# Patient Record
Sex: Female | Born: 1995
Health system: Southern US, Community
[De-identification: ages and names within clinical notes are randomized; demographics above are authoritative.]

## PROBLEM LIST (undated history)

## (undated) DIAGNOSIS — F329 Major depressive disorder, single episode, unspecified: Secondary | ICD-10-CM

## (undated) DIAGNOSIS — N62 Hypertrophy of breast: Secondary | ICD-10-CM

## (undated) DIAGNOSIS — M549 Dorsalgia, unspecified: Secondary | ICD-10-CM

## (undated) DIAGNOSIS — G8929 Other chronic pain: Secondary | ICD-10-CM

## (undated) DIAGNOSIS — F419 Anxiety disorder, unspecified: Secondary | ICD-10-CM

## (undated) DIAGNOSIS — M542 Cervicalgia: Secondary | ICD-10-CM

## (undated) DIAGNOSIS — F32A Depression, unspecified: Secondary | ICD-10-CM

## (undated) HISTORY — PX: WISDOM TOOTH EXTRACTION: SHX21

## (undated) NOTE — *Deleted (*Deleted)
Psychoeducational Group Note  Date: 09-13-20 Time:  1300  Group Topic/Focus:  Making Healthy Choices:   The focus of this group is to help patients identify negative/unhealthy choices they were using prior to admission and identify positive/healthier coping strategies to replace them upon discharge.  Participation Level:  Active  Participation Quality:  Appropriate  Affect:  Appropriate  Cognitive:  Oriented  Insight:  Improving  Engagement in Group:  Engaged  Additional Comments:    Judge, Christine A   

---
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
## 2015-01-28 ENCOUNTER — Encounter: Payer: Self-pay | Admitting: Medical

## 2015-01-28 ENCOUNTER — Telehealth: Payer: Self-pay

## 2015-01-28 ENCOUNTER — Ambulatory Visit (INDEPENDENT_AMBULATORY_CARE_PROVIDER_SITE_OTHER): Payer: Managed Care, Other (non HMO) | Admitting: Medical

## 2015-01-28 VITALS — BP 128/85 | HR 84 | Temp 97.9°F | Ht 63.2 in | Wt 222.2 lb

## 2015-01-28 DIAGNOSIS — N943 Premenstrual tension syndrome: Secondary | ICD-10-CM | POA: Diagnosis not present

## 2015-01-28 MED ORDER — DICLOFENAC SODIUM 75 MG PO TBEC: 75.0000 mg | DELAYED_RELEASE_TABLET | Freq: Two times a day (BID) | ORAL | Status: DC

## 2015-01-28 NOTE — Progress Notes (Signed)
   Subjective:    Patient ID: Debbie Alvarez, female    DOB: 1996-04-09, 19 y.o.   MRN: [id number]  HPI   Pt in states in past history of irregular cycles. In past would go every 2-3 months. But when would menstruate would be about a week but heavy. Over past year her cycles are regular but more brief but more pain with her cycles. Total 4-5 days of menstrual cramping pain. Pt lmp was beginning of February 1st. Preceding month menses jan 1. No pain presently. Pt states 1st day most painful. Then pain decrease with alleve or ibuprofen. Pt is not sexually active. Pt is a [employer] senior.   Review of Systems  Constitutional: Negative for fever, chills and fatigue.  Respiratory: Negative for cough, choking, chest tightness, shortness of breath and wheezing.   Cardiovascular: Negative for chest pain and palpitations.  Gastrointestinal: Negative for abdominal pain.  Genitourinary: Positive for menstrual problem. Negative for dysuria, frequency, flank pain, vaginal bleeding, vaginal discharge, enuresis and pelvic pain.       Menstrual pain. Moderate. Worst 1st day. Lasting total 4-5 days.  No current symptoms.  Musculoskeletal: Negative for back pain.  Neurological: Negative for dizziness, seizures, syncope, facial asymmetry, weakness, light-headedness, numbness and headaches.  Hematological: Negative for adenopathy. Does not bruise/bleed easily.  Psychiatric/Behavioral: Negative for suicidal ideas, hallucinations, behavioral problems, confusion, self-injury and dysphoric mood. The patient is not nervous/anxious.     Past Medical History  Diagnosis Date  . Tonsillar and adenoid hypertrophy 11/2014    snores during sleep, mother denies apnea    History   Social History  . Marital Status: Single    Spouse Name: N/A  . Number of Children: N/A  . Years of Education: N/A   Occupational History  . Not on file.   Social History Main Topics  . Smoking status: Never Smoker   .  Smokeless tobacco: Never Used  . Alcohol Use: No  . Drug Use: No  . Sexual Activity: Not on file   Other Topics Concern  . Not on file   Social History Narrative    Past Surgical History  Procedure Laterality Date  . No past surgeries    . Tonsillectomy/adenoidectomy/turbinate reduction N/A 12/01/2014    Procedure: TONSILLECTOMY/ADENOIDECTOMY/TURBINATE REDUCTION;  Surgeon: Drema Halonhristopher E Newman, MD;  Location: [facility];  Service: ENT;  Laterality: N/A;    No family history on file.  No Known Allergies  No current outpatient prescriptions on file prior to visit.   No current facility-administered medications on file prior to visit.    BP 128/85 mmHg  Pulse 84  Temp(Src) 97.9 F (36.6 C) (Oral)  Ht 5' 3.2" (1.605 m)  Wt 222 lb 3.2 oz (100.789 kg)  BMI 39.13 kg/m2  SpO2 98%  LMP 01/05/2015      Objective:   Physical Exam  General Appearance- Not in acute distress.  HEENT Eyes- Scleraeral/Conjuntiva-bilat- Not Yellow. Mouth & Throat- Normal.  Chest and Lung Exam Auscultation: Breath sounds:-Normal. Adventitious sounds:- No Adventitious sounds.  Cardiovascular Auscultation:Rythm - Regular. Heart Sounds -Normal heart sounds.  Abdomen Inspection:-Inspection Normal.  Palpation/Perucssion: Palpation and Percussion of the abdomen reveal- Non Tender, No Rebound tenderness, No rigidity(Guarding) and No Palpable abdominal masses.  Liver:-Normal.  Spleen:- Normal.   No suprapubic pain. NO pain over adenexa.        Assessment & Plan:

## 2015-01-28 NOTE — Telephone Encounter (Signed)
School note provided.

## 2015-01-28 NOTE — Patient Instructions (Addendum)
Premenstrual syndrome I think you have normal variant cramping with your cycle. I will give you stronger rx anti- inflammatory to take a couple of days prior to onset of menses and take during menses. If you get heavy or irregular cycles then would recommend/offer ocp.(or if planning to be sexually active. If your have pain over ovary directly that is significant then would recommend ultrasound to evaluate for cyst.(But I d not think this is occuring.)        Not to take any other otc nsaid while on diclofenac rx. Follow up 1 month or as needed.  Premenstrual Syndrome Premenstrual syndrome (PMS) is a condition that consists of physical, emotional, and behavioral symptoms that affect women of childbearing age. PMS occurs 5-14 days before the start of a menstrual period and often recurs in a predictable pattern. The symptoms go away a few days after the menstrual period starts. PMS can interfere in many ways with normal daily activities and can range from mild to severe. When PMS is considered severe, it may be diagnosed as premenstrual dysphoric disorder (PMDD). A small percentage of women are affected by PMS symptoms and an even smaller percentage of those women are affected by PMDD.  CAUSES  The exact cause of PMS is unknown, but it seems to be related to cyclic hormone changes that happen before menstruation. These hormones are thought to affect chemicals in the brain (serotonin) that can influence a person's mood.  SYMPTOMS  Symptoms of PMS recur consistently from month to month and go away completely after the menstrual period starts. The most common emotional or behavioral symptom is mood swings. These mood swings can be disabling and interfere with normal activities of daily living. Other common symptoms include depression and angry outbursts. Other symptoms may include:   Irritability.  Anxiety.  Crying spells.   Food cravings or appetite changes.   Changes in sexual desire.    Confusion.   Aggression.   Social withdrawal.   Poor concentration. The most common physical symptoms include a sense of bloating, breast pain, headaches, and extreme fatigue. Other physical symptoms include:   Backaches.   Swelling of the hands and feet.   Weight gain.   Hot flashes.  DIAGNOSIS  To make a diagnosis, your caregiver will ask questions to confirm that you are having a pattern of symptoms. Symptoms must:   Be present 5 days before the start of your period and be present at least 3 months in a row.   End within 4 days after your period starts.   Interfere with some of your normal activities.  Other conditions, such as thyroid disease, depression, and migraine headaches must be ruled out before a diagnosis of PMS is confirmed.  TREATMENT  Your caregiver may suggest ways to maintain a healthy lifestyle, such as exercise. Over-the-counter pain relievers may ease cramps, aches, pains, headaches, and breast tenderness. However, selective serotonin reuptake inhibitors (SSRIs) are medicines that are most beneficial in improving PMS if taken in the second half of the monthly cycle. They may be taken on a daily basis. The most effective oral contraceptive pill used for symptoms of PMS is one that contains the ingredient drospirenone. Taking 4 days off of the pill instead of the usual 7 days also has shown to increase effectiveness.  There are a number of drugs, dietary supplements, vitamins, and water pills (diuretics) which have been suggested to be helpful but have not shown to be of any benefit to improving PMS symptoms.  HOME CARE INSTRUCTIONS   For 2-3 months, write down your symptoms, their severity, and how long they last. This may help your caregiver prescribe the best treatment for your symptoms.  Exercise regularly as suggested by your caregiver.  Eat a regular, well-balanced diet.  Avoid caffeine, alcohol, and tobacco consumption.  Limit salt and  salty foods to lessen bloating and fluid retention.  Get enough sleep. Practice relaxation techniques.  Drink enough fluids to keep your urine clear or pale yellow.  Take medicines as directed by your caregiver.  Limit stress.  Take a multivitamin as directed by your caregiver. Document Released: 11/18/2000 Document Revised: 08/15/2012 Document Reviewed: 04/09/2012 [facility] Patient Information 2015 [employer], [place]. This information is not intended to replace advice given to you by your health care provider. Make sure you discuss any questions you have with your health care provider.

## 2015-01-28 NOTE — Progress Notes (Signed)
Pre visit review using our clinic review tool, if applicable. No additional management support is needed unless otherwise documented below in the visit note. 

## 2015-01-28 NOTE — Assessment & Plan Note (Signed)
I think you have normal variant cramping with your cycle. I will give you stronger rx anti- inflammatory to take a couple of days prior to onset of menses and take during menses. If you get heavy or irregular cycles then would recommend/offer ocp.(or if planning to be sexually active. If your have pain over ovary directly that is significant then would recommend ultrasound to evaluate for cyst.(But I d not think this is occuring.)

## 2015-03-16 ENCOUNTER — Ambulatory Visit (INDEPENDENT_AMBULATORY_CARE_PROVIDER_SITE_OTHER): Payer: Managed Care, Other (non HMO) | Admitting: Family Medicine

## 2015-03-16 ENCOUNTER — Encounter: Payer: Self-pay | Admitting: Family Medicine

## 2015-03-16 VITALS — BP 110/76 | HR 73 | Temp 98.8°F | Resp 16 | Ht 63.5 in | Wt 224.0 lb

## 2015-03-16 DIAGNOSIS — Z0000 Encounter for general adult medical examination without abnormal findings: Secondary | ICD-10-CM

## 2015-03-16 NOTE — Progress Notes (Signed)
Subjective:     Debbie Alvarez is a 19 y.o. female and is here for a comprehensive physical exam. The patient reports no problems.  History   Social History  . Marital Status: Single    Spouse Name: N/A  . Number of Children: N/A  . Years of Education: N/A   Occupational History  .      student   Social History Main Topics  . Smoking status: Never Smoker   . Smokeless tobacco: Never Used  . Alcohol Use: No  . Drug Use: No  . Sexual Activity: Not Currently    Birth Control/ Protection: Abstinence   Other Topics Concern  . Not on file   Social History Narrative   Exercise-- try, works out at home   Health Maintenance  Topic Date Due  . HIV Screening  10/21/2011  . PAP SMEAR  10/20/2014  . 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 She  does not have any pertinent problems on file. She  has past surgical history that includes No past surgeries and Tonsillectomy/adenoidectomy/turbinate reduction (N/A, 12/01/2014). Her family history includes Hypertension in her maternal grandmother. She  reports that she has never smoked. She has never used smokeless tobacco. She reports that she does not drink alcohol or use illicit drugs. She currently has no medications in their medication list. No current outpatient prescriptions on file prior to visit.   No current facility-administered medications on file prior to visit.   She has No Known Allergies..  Review of Systems Review of Systems  Constitutional: Negative for activity change, appetite change and fatigue.  HENT: Negative for hearing loss, congestion, tinnitus and ear discharge.  dentist [id number] Eyes: Negative for visual disturbance (see optho q1y -- vision corrected to 20/20 with glasses).  Respiratory: Negative for cough, chest tightness and shortness of  breath.   Cardiovascular: Negative for chest pain, palpitations and leg swelling.  Gastrointestinal: Negative for abdominal pain, diarrhea, constipation and abdominal distention.  Genitourinary: Negative for urgency, frequency, decreased urine volume and difficulty urinating.  Musculoskeletal: Negative for back pain, arthralgias and gait problem.  Skin: Negative for color change, pallor and rash.  Neurological: Negative for dizziness, light-headedness, numbness and headaches.  Hematological: Negative for adenopathy. Does not bruise/bleed easily.  Psychiatric/Behavioral: Negative for suicidal ideas, confusion, sleep disturbance, self-injury, dysphoric mood, decreased concentration and agitation.       Objective:    BP 110/76 mmHg  Pulse 73  Temp(Src) 98.8 F (37.1 C) (Oral)  Resp 16  Ht 5' 3.5" (1.613 m)  Wt 224 lb (101.606 kg)  BMI 39.05 kg/m2  SpO2 99%  LMP 03/13/2015 General appearance: alert, cooperative, appears stated age and no distress Head: Normocephalic, without obvious abnormality, atraumatic Eyes: conjunctivae/corneas clear. PERRL, EOM's intact. Fundi benign. Ears: normal TM's and external ear canals both ears Nose: Nares normal. Septum midline. Mucosa normal. No drainage or sinus tenderness. Throat: lips, mucosa, and tongue normal; teeth and gums normal Neck: no adenopathy, no carotid bruit, no JVD, supple, symmetrical, trachea midline and thyroid not enlarged, symmetric, no tenderness/mass/nodules Back: symmetric, no curvature. ROM normal. No CVA tenderness. Lungs: clear to auscultation bilaterally Breasts: normal appearance, no masses or tenderness Heart: regular rate and rhythm, S1, S2 normal, no murmur, click, rub or gallop Abdomen: soft, non-tender; bowel sounds normal; no masses,  no organomegaly Pelvic: deferred  Extremities: extremities normal, atraumatic, no cyanosis or edema Pulses: 2+ and symmetric Skin: Skin color, texture, turgor normal. No rashes or  lesions Lymph nodes: Cervical, supraclavicular, and axillary nodes normal. Neurologic: Alert and oriented X 3, normal strength and tone. Normal symmetric reflexes. Normal coordination and gait Psych-- no depression, no anxiety      Assessment:    Healthy female exam.      Plan:    ghm utd Check labs See After Visit Summary for Counseling Recommendations   Need vaccination records

## 2015-03-16 NOTE — Progress Notes (Signed)
Pre visit review using our clinic review tool, if applicable. No additional management support is needed unless otherwise documented below in the visit note. 

## 2015-03-16 NOTE — Patient Instructions (Signed)
Preventive Care for Adults A healthy lifestyle and preventive care can promote health and wellness. Preventive health guidelines for women include the following key practices.  A routine yearly physical is a good way to check with your health care provider about your health and preventive screening. It is a chance to share any concerns and updates on your health and to receive a thorough exam.  Visit your dentist for a routine exam and preventive care every 6 months. Brush your teeth twice a day and floss once a day. Good oral hygiene prevents tooth decay and gum disease.  The frequency of eye exams is based on your age, health, family medical history, use of contact lenses, and other factors. Follow your health care provider's recommendations for frequency of eye exams.  Eat a healthy diet. Foods like vegetables, fruits, whole grains, low-fat dairy products, and lean protein foods contain the nutrients you need without too many calories. Decrease your intake of foods high in solid fats, added sugars, and salt. Eat the right amount of calories for you.Get information about a proper diet from your health care provider, if necessary.  Regular physical exercise is one of the most important things you can do for your health. Most adults should get at least 150 minutes of moderate-intensity exercise (any activity that increases your heart rate and causes you to sweat) each week. In addition, most adults need muscle-strengthening exercises on 2 or more days a week.  Maintain a healthy weight. The body mass index (BMI) is a screening tool to identify possible weight problems. It provides an estimate of body fat based on height and weight. Your health care provider can find your BMI and can help you achieve or maintain a healthy weight.For adults 20 years and older:  A BMI below 18.5 is considered underweight.  A BMI of 18.5 to 24.9 is normal.  A BMI of 25 to 29.9 is considered overweight.  A BMI of  30 and above is considered obese.  Maintain normal blood lipids and cholesterol levels by exercising and minimizing your intake of saturated fat. Eat a balanced diet with plenty of fruit and vegetables. Blood tests for lipids and cholesterol should begin at age 76 and be repeated every 5 years. If your lipid or cholesterol levels are high, you are over 50, or you are at high risk for heart disease, you may need your cholesterol levels checked more frequently.Ongoing high lipid and cholesterol levels should be treated with medicines if diet and exercise are not working.  If you smoke, find out from your health care provider how to quit. If you do not use tobacco, do not start.  Lung cancer screening is recommended for adults aged 22-80 years who are at high risk for developing lung cancer because of a history of smoking. A yearly low-dose CT scan of the lungs is recommended for people who have at least a 30-pack-year history of smoking and are a current smoker or have quit within the past 15 years. A pack year of smoking is smoking an average of 1 pack of cigarettes a day for 1 year (for example: 1 pack a day for 30 years or 2 packs a day for 15 years). Yearly screening should continue until the smoker has stopped smoking for at least 15 years. Yearly screening should be stopped for people who develop a health problem that would prevent them from having lung cancer treatment.  If you are pregnant, do not drink alcohol. If you are breastfeeding,  be very cautious about drinking alcohol. If you are not pregnant and choose to drink alcohol, do not have more than 1 drink per day. One drink is considered to be 12 ounces (355 mL) of beer, 5 ounces (148 mL) of wine, or 1.5 ounces (44 mL) of liquor.  Avoid use of street drugs. Do not share needles with anyone. Ask for help if you need support or instructions about stopping the use of drugs.  High blood pressure causes heart disease and increases the risk of  stroke. Your blood pressure should be checked at least every 1 to 2 years. Ongoing high blood pressure should be treated with medicines if weight loss and exercise do not work.  If you are 75-52 years old, ask your health care provider if you should take aspirin to prevent strokes.  Diabetes screening involves taking a blood sample to check your fasting blood sugar level. This should be done once every 3 years, after age 15, if you are within normal weight and without risk factors for diabetes. Testing should be considered at a younger age or be carried out more frequently if you are overweight and have at least 1 risk factor for diabetes.  Breast cancer screening is essential preventive care for women. You should practice "breast self-awareness." This means understanding the normal appearance and feel of your breasts and may include breast self-examination. Any changes detected, no matter how small, should be reported to a health care provider. Women in their 58s and 30s should have a clinical breast exam (CBE) by a health care provider as part of a regular health exam every 1 to 3 years. After age 16, women should have a CBE every year. Starting at age 53, women should consider having a mammogram (breast X-ray test) every year. Women who have a family history of breast cancer should talk to their health care provider about genetic screening. Women at a high risk of breast cancer should talk to their health care providers about having an MRI and a mammogram every year.  Breast cancer gene (BRCA)-related cancer risk assessment is recommended for women who have family members with BRCA-related cancers. BRCA-related cancers include breast, ovarian, tubal, and peritoneal cancers. Having family members with these cancers may be associated with an increased risk for harmful changes (mutations) in the breast cancer genes BRCA1 and BRCA2. Results of the assessment will determine the need for genetic counseling and  BRCA1 and BRCA2 testing.  Routine pelvic exams to screen for cancer are no longer recommended for nonpregnant women who are considered low risk for cancer of the pelvic organs (ovaries, uterus, and vagina) and who do not have symptoms. Ask your health care provider if a screening pelvic exam is right for you.  If you have had past treatment for cervical cancer or a condition that could lead to cancer, you need Pap tests and screening for cancer for at least 20 years after your treatment. If Pap tests have been discontinued, your risk factors (such as having a new sexual partner) need to be reassessed to determine if screening should be resumed. Some women have medical problems that increase the chance of getting cervical cancer. In these cases, your health care provider may recommend more frequent screening and Pap tests.  The HPV test is an additional test that may be used for cervical cancer screening. The HPV test looks for the virus that can cause the cell changes on the cervix. The cells collected during the Pap test can be  tested for HPV. The HPV test could be used to screen women aged 30 years and older, and should be used in women of any age who have unclear Pap test results. After the age of 30, women should have HPV testing at the same frequency as a Pap test.  Colorectal cancer can be detected and often prevented. Most routine colorectal cancer screening begins at the age of 50 years and continues through age 75 years. However, your health care provider may recommend screening at an earlier age if you have risk factors for colon cancer. On a yearly basis, your health care provider may provide home test kits to check for hidden blood in the stool. Use of a small camera at the end of a tube, to directly examine the colon (sigmoidoscopy or colonoscopy), can detect the earliest forms of colorectal cancer. Talk to your health care provider about this at age 50, when routine screening begins. Direct  exam of the colon should be repeated every 5-10 years through age 75 years, unless early forms of pre-cancerous polyps or small growths are found.  People who are at an increased risk for hepatitis B should be screened for this virus. You are considered at high risk for hepatitis B if:  You were born in a country where hepatitis B occurs often. Talk with your health care provider about which countries are considered high risk.  Your parents were born in a high-risk country and you have not received a shot to protect against hepatitis B (hepatitis B vaccine).  You have HIV or AIDS.  You use needles to inject street drugs.  You live with, or have sex with, someone who has hepatitis B.  You get hemodialysis treatment.  You take certain medicines for conditions like cancer, organ transplantation, and autoimmune conditions.  Hepatitis C blood testing is recommended for all people born from 1945 through 1965 and any individual with known risks for hepatitis C.  Practice safe sex. Use condoms and avoid high-risk sexual practices to reduce the spread of sexually transmitted infections (STIs). STIs include gonorrhea, chlamydia, syphilis, trichomonas, herpes, HPV, and human immunodeficiency virus (HIV). Herpes, HIV, and HPV are viral illnesses that have no cure. They can result in disability, cancer, and death.  You should be screened for sexually transmitted illnesses (STIs) including gonorrhea and chlamydia if:  You are sexually active and are younger than 24 years.  You are older than 24 years and your health care provider tells you that you are at risk for this type of infection.  Your sexual activity has changed since you were last screened and you are at an increased risk for chlamydia or gonorrhea. Ask your health care provider if you are at risk.  If you are at risk of being infected with HIV, it is recommended that you take a prescription medicine daily to prevent HIV infection. This is  called preexposure prophylaxis (PrEP). You are considered at risk if:  You are a heterosexual woman, are sexually active, and are at increased risk for HIV infection.  You take drugs by injection.  You are sexually active with a partner who has HIV.  Talk with your health care provider about whether you are at high risk of being infected with HIV. If you choose to begin PrEP, you should first be tested for HIV. You should then be tested every 3 months for as long as you are taking PrEP.  Osteoporosis is a disease in which the bones lose minerals and strength   with aging. This can result in serious bone fractures or breaks. The risk of osteoporosis can be identified using a bone density scan. Women ages 65 years and over and women at risk for fractures or osteoporosis should discuss screening with their health care providers. Ask your health care provider whether you should take a calcium supplement or vitamin D to reduce the rate of osteoporosis.  Menopause can be associated with physical symptoms and risks. Hormone replacement therapy is available to decrease symptoms and risks. You should talk to your health care provider about whether hormone replacement therapy is right for you.  Use sunscreen. Apply sunscreen liberally and repeatedly throughout the day. You should seek shade when your shadow is shorter than you. Protect yourself by wearing long sleeves, pants, a wide-brimmed hat, and sunglasses year round, whenever you are outdoors.  Once a month, do a whole body skin exam, using a mirror to look at the skin on your back. Tell your health care provider of new moles, moles that have irregular borders, moles that are larger than a pencil eraser, or moles that have changed in shape or color.  Stay current with required vaccines (immunizations).  Influenza vaccine. All adults should be immunized every year.  Tetanus, diphtheria, and acellular pertussis (Td, Tdap) vaccine. Pregnant women should  receive 1 dose of Tdap vaccine during each pregnancy. The dose should be obtained regardless of the length of time since the last dose. Immunization is preferred during the 27th-36th week of gestation. An adult who has not previously received Tdap or who does not know her vaccine status should receive 1 dose of Tdap. This initial dose should be followed by tetanus and diphtheria toxoids (Td) booster doses every 10 years. Adults with an unknown or incomplete history of completing a 3-dose immunization series with Td-containing vaccines should begin or complete a primary immunization series including a Tdap dose. Adults should receive a Td booster every 10 years.  Varicella vaccine. An adult without evidence of immunity to varicella should receive 2 doses or a second dose if she has previously received 1 dose. Pregnant females who do not have evidence of immunity should receive the first dose after pregnancy. This first dose should be obtained before leaving the health care facility. The second dose should be obtained 4-8 weeks after the first dose.  Human papillomavirus (HPV) vaccine. Females aged 13-26 years who have not received the vaccine previously should obtain the 3-dose series. The vaccine is not recommended for use in pregnant females. However, pregnancy testing is not needed before receiving a dose. If a female is found to be pregnant after receiving a dose, no treatment is needed. In that case, the remaining doses should be delayed until after the pregnancy. Immunization is recommended for any person with an immunocompromised condition through the age of 26 years if she did not get any or all doses earlier. During the 3-dose series, the second dose should be obtained 4-8 weeks after the first dose. The third dose should be obtained 24 weeks after the first dose and 16 weeks after the second dose.  Zoster vaccine. One dose is recommended for adults aged 60 years or older unless certain conditions are  present.  Measles, mumps, and rubella (MMR) vaccine. Adults born before 1957 generally are considered immune to measles and mumps. Adults born in 1957 or later should have 1 or more doses of MMR vaccine unless there is a contraindication to the vaccine or there is laboratory evidence of immunity to   each of the three diseases. A routine second dose of MMR vaccine should be obtained at least 28 days after the first dose for students attending postsecondary schools, health care workers, or international travelers. People who received inactivated measles vaccine or an unknown type of measles vaccine during 1963-1967 should receive 2 doses of MMR vaccine. People who received inactivated mumps vaccine or an unknown type of mumps vaccine before 1979 and are at high risk for mumps infection should consider immunization with 2 doses of MMR vaccine. For females of childbearing age, rubella immunity should be determined. If there is no evidence of immunity, females who are not pregnant should be vaccinated. If there is no evidence of immunity, females who are pregnant should delay immunization until after pregnancy. Unvaccinated health care workers born before 1957 who lack laboratory evidence of measles, mumps, or rubella immunity or laboratory confirmation of disease should consider measles and mumps immunization with 2 doses of MMR vaccine or rubella immunization with 1 dose of MMR vaccine.  Pneumococcal 13-valent conjugate (PCV13) vaccine. When indicated, a person who is uncertain of her immunization history and has no record of immunization should receive the PCV13 vaccine. An adult aged 19 years or older who has certain medical conditions and has not been previously immunized should receive 1 dose of PCV13 vaccine. This PCV13 should be followed with a dose of pneumococcal polysaccharide (PPSV23) vaccine. The PPSV23 vaccine dose should be obtained at least 8 weeks after the dose of PCV13 vaccine. An adult aged 19  years or older who has certain medical conditions and previously received 1 or more doses of PPSV23 vaccine should receive 1 dose of PCV13. The PCV13 vaccine dose should be obtained 1 or more years after the last PPSV23 vaccine dose.  Pneumococcal polysaccharide (PPSV23) vaccine. When PCV13 is also indicated, PCV13 should be obtained first. All adults aged 65 years and older should be immunized. An adult younger than age 65 years who has certain medical conditions should be immunized. Any person who resides in a nursing home or long-term care facility should be immunized. An adult smoker should be immunized. People with an immunocompromised condition and certain other conditions should receive both PCV13 and PPSV23 vaccines. People with human immunodeficiency virus (HIV) infection should be immunized as soon as possible after diagnosis. Immunization during chemotherapy or radiation therapy should be avoided. Routine use of PPSV23 vaccine is not recommended for American Indians, [place] Natives, or people younger than 65 years unless there are medical conditions that require PPSV23 vaccine. When indicated, people who have unknown immunization and have no record of immunization should receive PPSV23 vaccine. One-time revaccination 5 years after the first dose of PPSV23 is recommended for people aged 19-64 years who have chronic kidney failure, nephrotic syndrome, asplenia, or immunocompromised conditions. People who received 1-2 doses of PPSV23 before age 65 years should receive another dose of PPSV23 vaccine at age 65 years or later if at least 5 years have passed since the previous dose. Doses of PPSV23 are not needed for people immunized with PPSV23 at or after age 65 years.  Meningococcal vaccine. Adults with asplenia or persistent complement component deficiencies should receive 2 doses of quadrivalent meningococcal conjugate (MenACWY-D) vaccine. The doses should be obtained at least 2 months apart.  Microbiologists working with certain meningococcal bacteria, military recruits, people at risk during an outbreak, and people who travel to or live in countries with a high rate of meningitis should be immunized. A first-year college student up through age   21 years who is living in a residence hall should receive a dose if she did not receive a dose on or after her 16th birthday. Adults who have certain high-risk conditions should receive one or more doses of vaccine.  Hepatitis A vaccine. Adults who wish to be protected from this disease, have certain high-risk conditions, work with hepatitis A-infected animals, work in hepatitis A research labs, or travel to or work in countries with a high rate of hepatitis A should be immunized. Adults who were previously unvaccinated and who anticipate close contact with an international adoptee during the first 60 days after arrival in the [place] from a country with a high rate of hepatitis A should be immunized.  Hepatitis B vaccine. Adults who wish to be protected from this disease, have certain high-risk conditions, may be exposed to blood or other infectious body fluids, are household contacts or sex partners of hepatitis B positive people, are clients or workers in certain care facilities, or travel to or work in countries with a high rate of hepatitis B should be immunized.  Haemophilus influenzae type b (Hib) vaccine. A previously unvaccinated person with asplenia or sickle cell disease or having a scheduled splenectomy should receive 1 dose of Hib vaccine. Regardless of previous immunization, a recipient of a hematopoietic stem cell transplant should receive a 3-dose series 6-12 months after her successful transplant. Hib vaccine is not recommended for adults with HIV infection. Preventive Services / Frequency Ages 64 to 68 years  Blood pressure check.** / Every 1 to 2 years.  Lipid and cholesterol check.** / Every 5 years beginning at age  22.  Clinical breast exam.** / Every 3 years for women in their 88s and 53s.  BRCA-related cancer risk assessment.** / For women who have family members with a BRCA-related cancer (breast, ovarian, tubal, or peritoneal cancers).  Pap test.** / Every 2 years from ages 90 through 51. Every 3 years starting at age 21 through age 56 or 3 with a history of 3 consecutive normal Pap tests.  HPV screening.** / Every 3 years from ages 24 through ages 1 to 46 with a history of 3 consecutive normal Pap tests.  Hepatitis C blood test.** / For any individual with known risks for hepatitis C.  Skin self-exam. / Monthly.  Influenza vaccine. / Every year.  Tetanus, diphtheria, and acellular pertussis (Tdap, Td) vaccine.** / Consult your health care provider. Pregnant women should receive 1 dose of Tdap vaccine during each pregnancy. 1 dose of Td every 10 years.  Varicella vaccine.** / Consult your health care provider. Pregnant females who do not have evidence of immunity should receive the first dose after pregnancy.  HPV vaccine. / 3 doses over 6 months, if 72 and younger. The vaccine is not recommended for use in pregnant females. However, pregnancy testing is not needed before receiving a dose.  Measles, mumps, rubella (MMR) vaccine.** / You need at least 1 dose of MMR if you were born in 1957 or later. You may also need a 2nd dose. For females of childbearing age, rubella immunity should be determined. If there is no evidence of immunity, females who are not pregnant should be vaccinated. If there is no evidence of immunity, females who are pregnant should delay immunization until after pregnancy.  Pneumococcal 13-valent conjugate (PCV13) vaccine.** / Consult your health care provider.  Pneumococcal polysaccharide (PPSV23) vaccine.** / 1 to 2 doses if you smoke cigarettes or if you have certain conditions.  Meningococcal vaccine.** /  1 dose if you are age 19 to 21 years and a first-year college  student living in a residence hall, or have one of several medical conditions, you need to get vaccinated against meningococcal disease. You may also need additional booster doses.  Hepatitis A vaccine.** / Consult your health care provider.  Hepatitis B vaccine.** / Consult your health care provider.  Haemophilus influenzae type b (Hib) vaccine.** / Consult your health care provider. Ages 40 to 64 years  Blood pressure check.** / Every 1 to 2 years.  Lipid and cholesterol check.** / Every 5 years beginning at age 20 years.  Lung cancer screening. / Every year if you are aged 55-80 years and have a 30-pack-year history of smoking and currently smoke or have quit within the past 15 years. Yearly screening is stopped once you have quit smoking for at least 15 years or develop a health problem that would prevent you from having lung cancer treatment.  Clinical breast exam.** / Every year after age 40 years.  BRCA-related cancer risk assessment.** / For women who have family members with a BRCA-related cancer (breast, ovarian, tubal, or peritoneal cancers).  Mammogram.** / Every year beginning at age 40 years and continuing for as long as you are in good health. Consult with your health care provider.  Pap test.** / Every 3 years starting at age 30 years through age 65 or 70 years with a history of 3 consecutive normal Pap tests.  HPV screening.** / Every 3 years from ages 30 years through ages 65 to 70 years with a history of 3 consecutive normal Pap tests.  Fecal occult blood test (FOBT) of stool. / Every year beginning at age 50 years and continuing until age 75 years. You may not need to do this test if you get a colonoscopy every 10 years.  Flexible sigmoidoscopy or colonoscopy.** / Every 5 years for a flexible sigmoidoscopy or every 10 years for a colonoscopy beginning at age 50 years and continuing until age 75 years.  Hepatitis C blood test.** / For all people born from 1945 through  1965 and any individual with known risks for hepatitis C.  Skin self-exam. / Monthly.  Influenza vaccine. / Every year.  Tetanus, diphtheria, and acellular pertussis (Tdap/Td) vaccine.** / Consult your health care provider. Pregnant women should receive 1 dose of Tdap vaccine during each pregnancy. 1 dose of Td every 10 years.  Varicella vaccine.** / Consult your health care provider. Pregnant females who do not have evidence of immunity should receive the first dose after pregnancy.  Zoster vaccine.** / 1 dose for adults aged 60 years or older.  Measles, mumps, rubella (MMR) vaccine.** / You need at least 1 dose of MMR if you were born in 1957 or later. You may also need a 2nd dose. For females of childbearing age, rubella immunity should be determined. If there is no evidence of immunity, females who are not pregnant should be vaccinated. If there is no evidence of immunity, females who are pregnant should delay immunization until after pregnancy.  Pneumococcal 13-valent conjugate (PCV13) vaccine.** / Consult your health care provider.  Pneumococcal polysaccharide (PPSV23) vaccine.** / 1 to 2 doses if you smoke cigarettes or if you have certain conditions.  Meningococcal vaccine.** / Consult your health care provider.  Hepatitis A vaccine.** / Consult your health care provider.  Hepatitis B vaccine.** / Consult your health care provider.  Haemophilus influenzae type b (Hib) vaccine.** / Consult your health care provider. Ages 65   years and over  Blood pressure check.** / Every 1 to 2 years.  Lipid and cholesterol check.** / Every 5 years beginning at age 22 years.  Lung cancer screening. / Every year if you are aged 73-80 years and have a 30-pack-year history of smoking and currently smoke or have quit within the past 15 years. Yearly screening is stopped once you have quit smoking for at least 15 years or develop a health problem that would prevent you from having lung cancer  treatment.  Clinical breast exam.** / Every year after age 4 years.  BRCA-related cancer risk assessment.** / For women who have family members with a BRCA-related cancer (breast, ovarian, tubal, or peritoneal cancers).  Mammogram.** / Every year beginning at age 40 years and continuing for as long as you are in good health. Consult with your health care provider.  Pap test.** / Every 3 years starting at age 9 years through age 34 or 91 years with 3 consecutive normal Pap tests. Testing can be stopped between 65 and 70 years with 3 consecutive normal Pap tests and no abnormal Pap or HPV tests in the past 10 years.  HPV screening.** / Every 3 years from ages 57 years through ages 64 or 45 years with a history of 3 consecutive normal Pap tests. Testing can be stopped between 65 and 70 years with 3 consecutive normal Pap tests and no abnormal Pap or HPV tests in the past 10 years.  Fecal occult blood test (FOBT) of stool. / Every year beginning at age 15 years and continuing until age 17 years. You may not need to do this test if you get a colonoscopy every 10 years.  Flexible sigmoidoscopy or colonoscopy.** / Every 5 years for a flexible sigmoidoscopy or every 10 years for a colonoscopy beginning at age 86 years and continuing until age 71 years.  Hepatitis C blood test.** / For all people born from [birth year] through 1965 and any individual with known risks for hepatitis C.  Osteoporosis screening.** / A one-time screening for women ages 83 years and over and women at risk for fractures or osteoporosis.  Skin self-exam. / Monthly.  Influenza vaccine. / Every year.  Tetanus, diphtheria, and acellular pertussis (Tdap/Td) vaccine.** / 1 dose of Td every 10 years.  Varicella vaccine.** / Consult your health care provider.  Zoster vaccine.** / 1 dose for adults aged 61 years or older.  Pneumococcal 13-valent conjugate (PCV13) vaccine.** / Consult your health care provider.  Pneumococcal  polysaccharide (PPSV23) vaccine.** / 1 dose for all adults aged 28 years and older.  Meningococcal vaccine.** / Consult your health care provider.  Hepatitis A vaccine.** / Consult your health care provider.  Hepatitis B vaccine.** / Consult your health care provider.  Haemophilus influenzae type b (Hib) vaccine.** / Consult your health care provider. ** Family history and personal history of risk and conditions may change your health care provider's recommendations. Document Released: 01/17/2002 Document Revised: 04/07/2014 Document Reviewed: 04/18/2011 [facility] Patient Information 2015 [place], [place]. This information is not intended to replace advice given to you by your health care provider. Make sure you discuss any questions you have with your health care provider.

## 2015-03-17 LAB — HEPATIC FUNCTION PANEL
ALT: 14 U/L (ref 0–35)
AST: 15 U/L (ref 0–37)
Albumin: 4 g/dL (ref 3.5–5.2)
Alkaline Phosphatase: 73 U/L (ref 47–119)
BILIRUBIN DIRECT: 0 mg/dL (ref 0.0–0.3)
TOTAL PROTEIN: 7.6 g/dL (ref 6.0–8.3)
Total Bilirubin: 0.3 mg/dL (ref 0.3–1.2)

## 2015-03-17 LAB — CBC WITH DIFFERENTIAL/PLATELET
Basophils Absolute: 0 10*3/uL (ref 0.0–0.1)
Basophils Relative: 0.4 % (ref 0.0–3.0)
EOS ABS: 0.3 10*3/uL (ref 0.0–0.7)
Eosinophils Relative: 4.5 % (ref 0.0–5.0)
HEMATOCRIT: 34.8 % — AB (ref 36.0–49.0)
HEMOGLOBIN: 11.6 g/dL — AB (ref 12.0–16.0)
Lymphocytes Relative: 38.6 % (ref 24.0–48.0)
Lymphs Abs: 2.6 10*3/uL (ref 0.7–4.0)
MCHC: 33.3 g/dL (ref 31.0–37.0)
MCV: 84.8 fl (ref 78.0–98.0)
Monocytes Absolute: 0.5 10*3/uL (ref 0.1–1.0)
Monocytes Relative: 7.4 % (ref 3.0–12.0)
NEUTROS ABS: 3.3 10*3/uL (ref 1.4–7.7)
Neutrophils Relative %: 49.1 % (ref 43.0–71.0)
PLATELETS: 289 10*3/uL (ref 150.0–575.0)
RBC: 4.11 Mil/uL (ref 3.80–5.70)
RDW: 15.2 % (ref 11.4–15.5)
WBC: 6.7 10*3/uL (ref 4.5–13.5)

## 2015-03-17 LAB — BASIC METABOLIC PANEL
BUN: 13 mg/dL (ref 6–23)
CHLORIDE: 104 meq/L (ref 96–112)
CO2: 27 mEq/L (ref 19–32)
Calcium: 9.9 mg/dL (ref 8.4–10.5)
Creatinine, Ser: 0.69 mg/dL (ref 0.40–1.20)
GFR: 141.87 mL/min (ref >60.00)
Glucose, Bld: 70 mg/dL (ref 70–99)
Potassium: 4.2 mEq/L (ref 3.5–5.1)
Sodium: 137 mEq/L (ref 135–145)

## 2015-03-17 LAB — LIPID PANEL
CHOLESTEROL: 209 mg/dL — AB (ref 0–200)
HDL: 51 mg/dL (ref >39.00)
LDL Cholesterol: 132 mg/dL — ABNORMAL HIGH (ref 0–99)
NONHDL: 158
Total CHOL/HDL Ratio: 4
Triglycerides: 132 mg/dL (ref 0.0–149.0)
VLDL: 26.4 mg/dL (ref 0.0–40.0)

## 2015-03-17 LAB — TSH: TSH: 2.26 u[IU]/mL (ref 0.40–5.00)

## 2015-03-17 LAB — HIV ANTIBODY (ROUTINE TESTING W REFLEX): HIV: NONREACTIVE

## 2016-02-11 ENCOUNTER — Telehealth: Payer: Self-pay | Admitting: Family Medicine

## 2016-02-11 NOTE — Telephone Encounter (Signed)
Faxed for medical records. # [id number]

## 2016-02-24 ENCOUNTER — Telehealth: Payer: Self-pay | Admitting: *Deleted

## 2016-02-24 NOTE — Telephone Encounter (Signed)
Received Medical Records; forwarded to provider/SLS 03/22

## 2016-03-21 ENCOUNTER — Ambulatory Visit (INDEPENDENT_AMBULATORY_CARE_PROVIDER_SITE_OTHER): Payer: Managed Care, Other (non HMO) | Admitting: Family Medicine

## 2016-03-21 ENCOUNTER — Encounter: Payer: Self-pay | Admitting: Family Medicine

## 2016-03-21 VITALS — BP 106/72 | HR 83 | Temp 99.0°F | Ht 64.0 in | Wt 234.4 lb

## 2016-03-21 DIAGNOSIS — Z0000 Encounter for general adult medical examination without abnormal findings: Secondary | ICD-10-CM | POA: Diagnosis not present

## 2016-03-21 DIAGNOSIS — Z23 Encounter for immunization: Secondary | ICD-10-CM | POA: Diagnosis not present

## 2016-03-21 DIAGNOSIS — R7989 Other specified abnormal findings of blood chemistry: Secondary | ICD-10-CM | POA: Diagnosis not present

## 2016-03-21 LAB — POCT URINALYSIS DIPSTICK
BILIRUBIN UA: NEGATIVE
GLUCOSE UA: NEGATIVE
LEUKOCYTES UA: NEGATIVE
NITRITE UA: NEGATIVE
Protein, UA: NEGATIVE
RBC UA: NEGATIVE
Spec Grav, UA: 1.02
Urobilinogen, UA: 0.2
pH, UA: 8

## 2016-03-21 NOTE — Progress Notes (Signed)
Subjective:     Debbie Alvarez is a 20 y.o. female and is here for a comprehensive physical exam. The patient reports no problems.  Social History   Social History  . Marital Status: Single    Spouse Name: N/A  . Number of Children: N/A  . Years of Education: N/A   Occupational History  .      student   Social History Main Topics  . Smoking status: Never Smoker   . Smokeless tobacco: Never Used  . Alcohol Use: No  . Drug Use: No  . Sexual Activity: Not Currently    Birth Control/ Protection: Abstinence   Other Topics Concern  . Not on file   Social History Narrative   Exercise-- try, works out at home   Health Maintenance  Topic Date Due  . INFLUENZA VACCINE  07/05/2016  . TETANUS/TDAP  07/05/2025  . 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 She  does not have any pertinent problems on file. She  has past surgical history that includes No past surgeries and Tonsillectomy/adenoidectomy/turbinate reduction (N/A, 12/01/2014). Her family history includes Hypertension in her maternal grandmother. She  reports that she has never smoked. She has never used smokeless tobacco. She reports that she does not drink alcohol or use illicit drugs. She currently has no medications in their medication list. No current outpatient prescriptions on file prior to visit.   No current facility-administered medications on file prior to visit.   She has No Known Allergies..  Review of Systems Review of Systems  Constitutional: Negative for activity change, appetite change and fatigue.  HENT: Negative for hearing loss, congestion, tinnitus and ear discharge.  dentist q50m Eyes: Negative for visual disturbance (see optho q1y -- vision corrected to 20/20 with glasses).  Respiratory: Negative for cough, chest tightness and shortness  of breath.   Cardiovascular: Negative for chest pain, palpitations and leg swelling.  Gastrointestinal: Negative for abdominal pain, diarrhea, constipation and abdominal distention.  Genitourinary: Negative for urgency, frequency, decreased urine volume and difficulty urinating.  Musculoskeletal: Negative for back pain, arthralgias and gait problem.  Skin: Negative for color change, pallor and rash.  Neurological: Negative for dizziness, light-headedness, numbness and headaches.  Hematological: Negative for adenopathy. Does not bruise/bleed easily.  Psychiatric/Behavioral: Negative for suicidal ideas, confusion, sleep disturbance, self-injury, dysphoric mood, decreased concentration and agitation.      Objective:    BP 106/72 mmHg  Pulse 83  Temp(Src) 99 F (37.2 C) (Oral)  Ht  (1.626 m)  Wt 234 lb 6.4 oz (106.323 kg)  BMI 40.21 kg/m2  SpO2 99%  LMP 03/07/2016 General appearance: alert, cooperative, appears stated age and no distress , obese Head: Normocephalic, without obvious abnormality, atraumatic Eyes: conjunctivae/corneas clear. PERRL, EOM's intact. Fundi benign. Ears: normal TM's and external ear canals both ears Nose: Nares normal. Septum midline. Mucosa normal. No drainage or sinus tenderness. Throat: lips, mucosa, and tongue normal; teeth and gums normal Neck: no adenopathy, no carotid bruit, no JVD, supple, symmetrical, trachea midline and thyroid not enlarged, symmetric, no tenderness/mass/nodules Back: symmetric, no curvature. ROM normal. No CVA tenderness. Lungs: clear to auscultation bilaterally Breasts: normal appearance, no masses or tenderness Heart: S1, S2 normal Abdomen: soft, non-tender; bowel sounds normal; no masses,  no organomegaly Pelvic: deferred Extremities: extremities normal, atraumatic, no cyanosis or edema Pulses: 2+  and symmetric Skin: Skin color, texture, turgor normal. No rashes or lesions Lymph nodes: Cervical, supraclavicular, and  axillary nodes normal. Neurologic: Alert and oriented X 3, normal strength and tone. Normal symmetric reflexes. Normal coordination and gait Psych- no depression, no anxiety      Assessment:    Healthy female exam.      Plan:     ghm utd  Check labs See After Visit Summary for Counseling Recommendations \  1. Preventative health care   - Comprehensive metabolic panel - CBC with Differential/Platelet - Lipid panel - POCT urinalysis dipstick - TSH  2. Need for HPV vaccination   - HPV 9-valent vaccine,Recombinat (Gardasil 9)

## 2016-03-21 NOTE — Patient Instructions (Signed)
Preventive Care for Adults, Female A healthy lifestyle and preventive care can promote health and wellness. Preventive health guidelines for women include the following key practices.  A routine yearly physical is a good way to check with your health care provider about your health and preventive screening. It is a chance to share any concerns and updates on your health and to receive a thorough exam.  Visit your dentist for a routine exam and preventive care every 6 months. Brush your teeth twice a day and floss once a day. Good oral hygiene prevents tooth decay and gum disease.  The frequency of eye exams is based on your age, health, family medical history, use of contact lenses, and other factors. Follow your health care provider's recommendations for frequency of eye exams.  Eat a healthy diet. Foods like vegetables, fruits, whole grains, low-fat dairy products, and lean protein foods contain the nutrients you need without too many calories. Decrease your intake of foods high in solid fats, added sugars, and salt. Eat the right amount of calories for you.Get information about a proper diet from your health care provider, if necessary.  Regular physical exercise is one of the most important things you can do for your health. Most adults should get at least 150 minutes of moderate-intensity exercise (any activity that increases your heart rate and causes you to sweat) each week. In addition, most adults need muscle-strengthening exercises on 2 or more days a week.  Maintain a healthy weight. The body mass index (BMI) is a screening tool to identify possible weight problems. It provides an estimate of body fat based on height and weight. Your health care provider can find your BMI and can help you achieve or maintain a healthy weight.For adults 20 years and older:  A BMI below 18.5 is considered underweight.  A BMI of 18.5 to 24.9 is normal.  A BMI of 25 to 29.9 is considered overweight.  A  BMI of 30 and above is considered obese.  Maintain normal blood lipids and cholesterol levels by exercising and minimizing your intake of saturated fat. Eat a balanced diet with plenty of fruit and vegetables. Blood tests for lipids and cholesterol should begin at age 45 and be repeated every 5 years. If your lipid or cholesterol levels are high, you are over 50, or you are at high risk for heart disease, you may need your cholesterol levels checked more frequently.Ongoing high lipid and cholesterol levels should be treated with medicines if diet and exercise are not working.  If you smoke, find out from your health care provider how to quit. If you do not use tobacco, do not start.  Lung cancer screening is recommended for adults aged 45-80 years who are at high risk for developing lung cancer because of a history of smoking. A yearly low-dose CT scan of the lungs is recommended for people who have at least a 30-pack-year history of smoking and are a current smoker or have quit within the past 15 years. A pack year of smoking is smoking an average of 1 pack of cigarettes a day for 1 year (for example: 1 pack a day for 30 years or 2 packs a day for 15 years). Yearly screening should continue until the smoker has stopped smoking for at least 15 years. Yearly screening should be stopped for people who develop a health problem that would prevent them from having lung cancer treatment.  If you are pregnant, do not drink alcohol. If you are  breastfeeding, be very cautious about drinking alcohol. If you are not pregnant and choose to drink alcohol, do not have more than 1 drink per day. One drink is considered to be 12 ounces (355 mL) of beer, 5 ounces (148 mL) of wine, or 1.5 ounces (44 mL) of liquor.  Avoid use of street drugs. Do not share needles with anyone. Ask for help if you need support or instructions about stopping the use of drugs.  High blood pressure causes heart disease and increases the risk  of stroke. Your blood pressure should be checked at least every 1 to 2 years. Ongoing high blood pressure should be treated with medicines if weight loss and exercise do not work.  If you are 55-79 years old, ask your health care provider if you should take aspirin to prevent strokes.  Diabetes screening is done by taking a blood sample to check your blood glucose level after you have not eaten for a certain period of time (fasting). If you are not overweight and you do not have risk factors for diabetes, you should be screened once every 3 years starting at age 45. If you are overweight or obese and you are 40-70 years of age, you should be screened for diabetes every year as part of your cardiovascular risk assessment.  Breast cancer screening is essential preventive care for women. You should practice "breast self-awareness." This means understanding the normal appearance and feel of your breasts and may include breast self-examination. Any changes detected, no matter how small, should be reported to a health care provider. Women in their 20s and 30s should have a clinical breast exam (CBE) by a health care provider as part of a regular health exam every 1 to 3 years. After age 40, women should have a CBE every year. Starting at age 40, women should consider having a mammogram (breast X-ray test) every year. Women who have a family history of breast cancer should talk to their health care provider about genetic screening. Women at a high risk of breast cancer should talk to their health care providers about having an MRI and a mammogram every year.  Breast cancer gene (BRCA)-related cancer risk assessment is recommended for women who have family members with BRCA-related cancers. BRCA-related cancers include breast, ovarian, tubal, and peritoneal cancers. Having family members with these cancers may be associated with an increased risk for harmful changes (mutations) in the breast cancer genes BRCA1 and  BRCA2. Results of the assessment will determine the need for genetic counseling and BRCA1 and BRCA2 testing.  Your health care provider may recommend that you be screened regularly for cancer of the pelvic organs (ovaries, uterus, and vagina). This screening involves a pelvic examination, including checking for microscopic changes to the surface of your cervix (Pap test). You may be encouraged to have this screening done every 3 years, beginning at age 21.  For women ages 30-65, health care providers may recommend pelvic exams and Pap testing every 3 years, or they may recommend the Pap and pelvic exam, combined with testing for human papilloma virus (HPV), every 5 years. Some types of HPV increase your risk of cervical cancer. Testing for HPV may also be done on women of any age with unclear Pap test results.  Other health care providers may not recommend any screening for nonpregnant women who are considered low risk for pelvic cancer and who do not have symptoms. Ask your health care provider if a screening pelvic exam is right for   you.  If you have had past treatment for cervical cancer or a condition that could lead to cancer, you need Pap tests and screening for cancer for at least 20 years after your treatment. If Pap tests have been discontinued, your risk factors (such as having a new sexual partner) need to be reassessed to determine if screening should resume. Some women have medical problems that increase the chance of getting cervical cancer. In these cases, your health care provider may recommend more frequent screening and Pap tests.  Colorectal cancer can be detected and often prevented. Most routine colorectal cancer screening begins at the age of 50 years and continues through age 75 years. However, your health care provider may recommend screening at an earlier age if you have risk factors for colon cancer. On a yearly basis, your health care provider may provide home test kits to check  for hidden blood in the stool. Use of a small camera at the end of a tube, to directly examine the colon (sigmoidoscopy or colonoscopy), can detect the earliest forms of colorectal cancer. Talk to your health care provider about this at age 50, when routine screening begins. Direct exam of the colon should be repeated every 5-10 years through age 75 years, unless early forms of precancerous polyps or small growths are found.  People who are at an increased risk for hepatitis B should be screened for this virus. You are considered at high risk for hepatitis B if:  You were born in a country where hepatitis B occurs often. Talk with your health care provider about which countries are considered high risk.  Your parents were born in a high-risk country and you have not received a shot to protect against hepatitis B (hepatitis B vaccine).  You have HIV or AIDS.  You use needles to inject street drugs.  You live with, or have sex with, someone who has hepatitis B.  You get hemodialysis treatment.  You take certain medicines for conditions like cancer, organ transplantation, and autoimmune conditions.  Hepatitis C blood testing is recommended for all people born from 1945 through 1965 and any individual with known risks for hepatitis C.  Practice safe sex. Use condoms and avoid high-risk sexual practices to reduce the spread of sexually transmitted infections (STIs). STIs include gonorrhea, chlamydia, syphilis, trichomonas, herpes, HPV, and human immunodeficiency virus (HIV). Herpes, HIV, and HPV are viral illnesses that have no cure. They can result in disability, cancer, and death.  You should be screened for sexually transmitted illnesses (STIs) including gonorrhea and chlamydia if:  You are sexually active and are younger than 24 years.  You are older than 24 years and your health care provider tells you that you are at risk for this type of infection.  Your sexual activity has changed  since you were last screened and you are at an increased risk for chlamydia or gonorrhea. Ask your health care provider if you are at risk.  If you are at risk of being infected with HIV, it is recommended that you take a prescription medicine daily to prevent HIV infection. This is called preexposure prophylaxis (PrEP). You are considered at risk if:  You are sexually active and do not regularly use condoms or know the HIV status of your partner(s).  You take drugs by injection.  You are sexually active with a partner who has HIV.  Talk with your health care provider about whether you are at high risk of being infected with HIV. If   you choose to begin PrEP, you should first be tested for HIV. You should then be tested every 3 months for as long as you are taking PrEP.  Osteoporosis is a disease in which the bones lose minerals and strength with aging. This can result in serious bone fractures or breaks. The risk of osteoporosis can be identified using a bone density scan. Women ages 67 years and over and women at risk for fractures or osteoporosis should discuss screening with their health care providers. Ask your health care provider whether you should take a calcium supplement or vitamin D to reduce the rate of osteoporosis.  Menopause can be associated with physical symptoms and risks. Hormone replacement therapy is available to decrease symptoms and risks. You should talk to your health care provider about whether hormone replacement therapy is right for you.  Use sunscreen. Apply sunscreen liberally and repeatedly throughout the day. You should seek shade when your shadow is shorter than you. Protect yourself by wearing long sleeves, pants, a wide-brimmed hat, and sunglasses year round, whenever you are outdoors.  Once a month, do a whole body skin exam, using a mirror to look at the skin on your back. Tell your health care provider of new moles, moles that have irregular borders, moles that  are larger than a pencil eraser, or moles that have changed in shape or color.  Stay current with required vaccines (immunizations).  Influenza vaccine. All adults should be immunized every year.  Tetanus, diphtheria, and acellular pertussis (Td, Tdap) vaccine. Pregnant women should receive 1 dose of Tdap vaccine during each pregnancy. The dose should be obtained regardless of the length of time since the last dose. Immunization is preferred during the 27th-36th week of gestation. An adult who has not previously received Tdap or who does not know her vaccine status should receive 1 dose of Tdap. This initial dose should be followed by tetanus and diphtheria toxoids (Td) booster doses every 10 years. Adults with an unknown or incomplete history of completing a 3-dose immunization series with Td-containing vaccines should begin or complete a primary immunization series including a Tdap dose. Adults should receive a Td booster every 10 years.  Varicella vaccine. An adult without evidence of immunity to varicella should receive 2 doses or a second dose if she has previously received 1 dose. Pregnant females who do not have evidence of immunity should receive the first dose after pregnancy. This first dose should be obtained before leaving the health care facility. The second dose should be obtained 4-8 weeks after the first dose.  Human papillomavirus (HPV) vaccine. Females aged 13-26 years who have not received the vaccine previously should obtain the 3-dose series. The vaccine is not recommended for use in pregnant females. However, pregnancy testing is not needed before receiving a dose. If a female is found to be pregnant after receiving a dose, no treatment is needed. In that case, the remaining doses should be delayed until after the pregnancy. Immunization is recommended for any person with an immunocompromised condition through the age of 61 years if she did not get any or all doses earlier. During the  3-dose series, the second dose should be obtained 4-8 weeks after the first dose. The third dose should be obtained 24 weeks after the first dose and 16 weeks after the second dose.  Zoster vaccine. One dose is recommended for adults aged 30 years or older unless certain conditions are present.  Measles, mumps, and rubella (MMR) vaccine. Adults born  before 1957 generally are considered immune to measles and mumps. Adults born in 1957 or later should have 1 or more doses of MMR vaccine unless there is a contraindication to the vaccine or there is laboratory evidence of immunity to each of the three diseases. A routine second dose of MMR vaccine should be obtained at least 28 days after the first dose for students attending postsecondary schools, health care workers, or international travelers. People who received inactivated measles vaccine or an unknown type of measles vaccine during 1963-1967 should receive 2 doses of MMR vaccine. People who received inactivated mumps vaccine or an unknown type of mumps vaccine before 1979 and are at high risk for mumps infection should consider immunization with 2 doses of MMR vaccine. For females of childbearing age, rubella immunity should be determined. If there is no evidence of immunity, females who are not pregnant should be vaccinated. If there is no evidence of immunity, females who are pregnant should delay immunization until after pregnancy. Unvaccinated health care workers born before 1957 who lack laboratory evidence of measles, mumps, or rubella immunity or laboratory confirmation of disease should consider measles and mumps immunization with 2 doses of MMR vaccine or rubella immunization with 1 dose of MMR vaccine.  Pneumococcal 13-valent conjugate (PCV13) vaccine. When indicated, a person who is uncertain of his immunization history and has no record of immunization should receive the PCV13 vaccine. All adults 65 years of age and older should receive this  vaccine. An adult aged 19 years or older who has certain medical conditions and has not been previously immunized should receive 1 dose of PCV13 vaccine. This PCV13 should be followed with a dose of pneumococcal polysaccharide (PPSV23) vaccine. Adults who are at high risk for pneumococcal disease should obtain the PPSV23 vaccine at least 8 weeks after the dose of PCV13 vaccine. Adults older than 20 years of age who have normal immune system function should obtain the PPSV23 vaccine dose at least 1 year after the dose of PCV13 vaccine.  Pneumococcal polysaccharide (PPSV23) vaccine. When PCV13 is also indicated, PCV13 should be obtained first. All adults aged 65 years and older should be immunized. An adult younger than age 65 years who has certain medical conditions should be immunized. Any person who resides in a nursing home or long-term care facility should be immunized. An adult smoker should be immunized. People with an immunocompromised condition and certain other conditions should receive both PCV13 and PPSV23 vaccines. People with human immunodeficiency virus (HIV) infection should be immunized as soon as possible after diagnosis. Immunization during chemotherapy or radiation therapy should be avoided. Routine use of PPSV23 vaccine is not recommended for American Indians, [place] Natives, or people younger than 65 years unless there are medical conditions that require PPSV23 vaccine. When indicated, people who have unknown immunization and have no record of immunization should receive PPSV23 vaccine. One-time revaccination 5 years after the first dose of PPSV23 is recommended for people aged 19-64 years who have chronic kidney failure, nephrotic syndrome, asplenia, or immunocompromised conditions. People who received 1-2 doses of PPSV23 before age 65 years should receive another dose of PPSV23 vaccine at age 65 years or later if at least 5 years have passed since the previous dose. Doses of PPSV23 are not  needed for people immunized with PPSV23 at or after age 65 years.  Meningococcal vaccine. Adults with asplenia or persistent complement component deficiencies should receive 2 doses of quadrivalent meningococcal conjugate (MenACWY-D) vaccine. The doses should be obtained   at least 2 months apart. Microbiologists working with certain meningococcal bacteria, [place] recruits, people at risk during an outbreak, and people who travel to or live in countries with a high rate of meningitis should be immunized. A first-year college student up through age 34 years who is living in a residence hall should receive a dose if she did not receive a dose on or after her 16th birthday. Adults who have certain high-risk conditions should receive one or more doses of vaccine.  Hepatitis A vaccine. Adults who wish to be protected from this disease, have certain high-risk conditions, work with hepatitis A-infected animals, work in hepatitis A research labs, or travel to or work in countries with a high rate of hepatitis A should be immunized. Adults who were previously unvaccinated and who anticipate close contact with an international adoptee during the first 60 days after arrival in the [place] from a country with a high rate of hepatitis A should be immunized.  Hepatitis B vaccine. Adults who wish to be protected from this disease, have certain high-risk conditions, may be exposed to blood or other infectious body fluids, are household contacts or sex partners of hepatitis B positive people, are clients or workers in certain care facilities, or travel to or work in countries with a high rate of hepatitis B should be immunized.  Haemophilus influenzae type b (Hib) vaccine. A previously unvaccinated person with asplenia or sickle cell disease or having a scheduled splenectomy should receive 1 dose of Hib vaccine. Regardless of previous immunization, a recipient of a hematopoietic stem cell transplant should receive a  3-dose series 6-12 months after her successful transplant. Hib vaccine is not recommended for adults with HIV infection. Preventive Services / Frequency Ages 35 to 4 years  Blood pressure check.** / Every 3-5 years.  Lipid and cholesterol check.** / Every 5 years beginning at age 60.  Clinical breast exam.** / Every 3 years for women in their 71s and 10s.  BRCA-related cancer risk assessment.** / For women who have family members with a BRCA-related cancer (breast, ovarian, tubal, or peritoneal cancers).  Pap test.** / Every 2 years from ages 76 through 26. Every 3 years starting at age 61 through age 76 or 93 with a history of 3 consecutive normal Pap tests.  HPV screening.** / Every 3 years from ages 37 through ages 60 to 51 with a history of 3 consecutive normal Pap tests.  Hepatitis C blood test.** / For any individual with known risks for hepatitis C.  Skin self-exam. / Monthly.  Influenza vaccine. / Every year.  Tetanus, diphtheria, and acellular pertussis (Tdap, Td) vaccine.** / Consult your health care provider. Pregnant women should receive 1 dose of Tdap vaccine during each pregnancy. 1 dose of Td every 10 years.  Varicella vaccine.** / Consult your health care provider. Pregnant females who do not have evidence of immunity should receive the first dose after pregnancy.  HPV vaccine. / 3 doses over 6 months, if 93 and younger. The vaccine is not recommended for use in pregnant females. However, pregnancy testing is not needed before receiving a dose.  Measles, mumps, rubella (MMR) vaccine.** / You need at least 1 dose of MMR if you were born in 1957 or later. You may also need a 2nd dose. For females of childbearing age, rubella immunity should be determined. If there is no evidence of immunity, females who are not pregnant should be vaccinated. If there is no evidence of immunity, females who are  pregnant should delay immunization until after pregnancy.  Pneumococcal  13-valent conjugate (PCV13) vaccine.** / Consult your health care provider.  Pneumococcal polysaccharide (PPSV23) vaccine.** / 1 to 2 doses if you smoke cigarettes or if you have certain conditions.  Meningococcal vaccine.** / 1 dose if you are age 68 to 8 years and a Market researcher living in a residence hall, or have one of several medical conditions, you need to get vaccinated against meningococcal disease. You may also need additional booster doses.  Hepatitis A vaccine.** / Consult your health care provider.  Hepatitis B vaccine.** / Consult your health care provider.  Haemophilus influenzae type b (Hib) vaccine.** / Consult your health care provider. Ages 7 to 53 years  Blood pressure check.** / Every year.  Lipid and cholesterol check.** / Every 5 years beginning at age 25 years.  Lung cancer screening. / Every year if you are aged 11-80 years and have a 30-pack-year history of smoking and currently smoke or have quit within the past 15 years. Yearly screening is stopped once you have quit smoking for at least 15 years or develop a health problem that would prevent you from having lung cancer treatment.  Clinical breast exam.** / Every year after age 48 years.  BRCA-related cancer risk assessment.** / For women who have family members with a BRCA-related cancer (breast, ovarian, tubal, or peritoneal cancers).  Mammogram.** / Every year beginning at age 41 years and continuing for as long as you are in good health. Consult with your health care provider.  Pap test.** / Every 3 years starting at age 65 years through age 37 or 70 years with a history of 3 consecutive normal Pap tests.  HPV screening.** / Every 3 years from ages 72 years through ages 60 to 40 years with a history of 3 consecutive normal Pap tests.  Fecal occult blood test (FOBT) of stool. / Every year beginning at age 21 years and continuing until age 5 years. You may not need to do this test if you get  a colonoscopy every 10 years.  Flexible sigmoidoscopy or colonoscopy.** / Every 5 years for a flexible sigmoidoscopy or every 10 years for a colonoscopy beginning at age 35 years and continuing until age 48 years.  Hepatitis C blood test.** / For all people born from [birth year] through 1965 and any individual with known risks for hepatitis C.  Skin self-exam. / Monthly.  Influenza vaccine. / Every year.  Tetanus, diphtheria, and acellular pertussis (Tdap/Td) vaccine.** / Consult your health care provider. Pregnant women should receive 1 dose of Tdap vaccine during each pregnancy. 1 dose of Td every 10 years.  Varicella vaccine.** / Consult your health care provider. Pregnant females who do not have evidence of immunity should receive the first dose after pregnancy.  Zoster vaccine.** / 1 dose for adults aged 30 years or older.  Measles, mumps, rubella (MMR) vaccine.** / You need at least 1 dose of MMR if you were born in 1957 or later. You may also need a second dose. For females of childbearing age, rubella immunity should be determined. If there is no evidence of immunity, females who are not pregnant should be vaccinated. If there is no evidence of immunity, females who are pregnant should delay immunization until after pregnancy.  Pneumococcal 13-valent conjugate (PCV13) vaccine.** / Consult your health care provider.  Pneumococcal polysaccharide (PPSV23) vaccine.** / 1 to 2 doses if you smoke cigarettes or if you have certain conditions.  Meningococcal vaccine.** /  Consult your health care provider.  Hepatitis A vaccine.** / Consult your health care provider.  Hepatitis B vaccine.** / Consult your health care provider.  Haemophilus influenzae type b (Hib) vaccine.** / Consult your health care provider. Ages 64 years and over  Blood pressure check.** / Every year.  Lipid and cholesterol check.** / Every 5 years beginning at age 23 years.  Lung cancer screening. / Every year if you  are aged 16-80 years and have a 30-pack-year history of smoking and currently smoke or have quit within the past 15 years. Yearly screening is stopped once you have quit smoking for at least 15 years or develop a health problem that would prevent you from having lung cancer treatment.  Clinical breast exam.** / Every year after age 74 years.  BRCA-related cancer risk assessment.** / For women who have family members with a BRCA-related cancer (breast, ovarian, tubal, or peritoneal cancers).  Mammogram.** / Every year beginning at age 44 years and continuing for as long as you are in good health. Consult with your health care provider.  Pap test.** / Every 3 years starting at age 58 years through age 22 or 39 years with 3 consecutive normal Pap tests. Testing can be stopped between 65 and 70 years with 3 consecutive normal Pap tests and no abnormal Pap or HPV tests in the past 10 years.  HPV screening.** / Every 3 years from ages 64 years through ages 70 or 61 years with a history of 3 consecutive normal Pap tests. Testing can be stopped between 65 and 70 years with 3 consecutive normal Pap tests and no abnormal Pap or HPV tests in the past 10 years.  Fecal occult blood test (FOBT) of stool. / Every year beginning at age 40 years and continuing until age 27 years. You may not need to do this test if you get a colonoscopy every 10 years.  Flexible sigmoidoscopy or colonoscopy.** / Every 5 years for a flexible sigmoidoscopy or every 10 years for a colonoscopy beginning at age 7 years and continuing until age 32 years.  Hepatitis C blood test.** / For all people born from [birth year] through 1965 and any individual with known risks for hepatitis C.  Osteoporosis screening.** / A one-time screening for women ages 30 years and over and women at risk for fractures or osteoporosis.  Skin self-exam. / Monthly.  Influenza vaccine. / Every year.  Tetanus, diphtheria, and acellular pertussis (Tdap/Td)  vaccine.** / 1 dose of Td every 10 years.  Varicella vaccine.** / Consult your health care provider.  Zoster vaccine.** / 1 dose for adults aged 35 years or older.  Pneumococcal 13-valent conjugate (PCV13) vaccine.** / Consult your health care provider.  Pneumococcal polysaccharide (PPSV23) vaccine.** / 1 dose for all adults aged 46 years and older.  Meningococcal vaccine.** / Consult your health care provider.  Hepatitis A vaccine.** / Consult your health care provider.  Hepatitis B vaccine.** / Consult your health care provider.  Haemophilus influenzae type b (Hib) vaccine.** / Consult your health care provider. ** Family history and personal history of risk and conditions may change your health care provider's recommendations.   This information is not intended to replace advice given to you by your health care provider. Make sure you discuss any questions you have with your health care provider.   Document Released: 01/17/2002 Document Revised: 12/12/2014 Document Reviewed: 04/18/2011 Elsevier Interactive Patient Education [employer].

## 2016-03-22 LAB — COMPREHENSIVE METABOLIC PANEL
ALK PHOS: 64 U/L (ref 47–119)
ALT: 16 U/L (ref 0–35)
AST: 15 U/L (ref 0–37)
Albumin: 4.1 g/dL (ref 3.5–5.2)
BILIRUBIN TOTAL: 0.4 mg/dL (ref 0.2–1.2)
BUN: 11 mg/dL (ref 6–23)
CO2: 31 mEq/L (ref 19–32)
Calcium: 9.6 mg/dL (ref 8.4–10.5)
Chloride: 102 mEq/L (ref 96–112)
Creatinine, Ser: 1.08 mg/dL (ref 0.40–1.20)
GFR: 83.68 mL/min (ref >60.00)
GLUCOSE: 73 mg/dL (ref 70–99)
Potassium: 4.3 mEq/L (ref 3.5–5.1)
SODIUM: 137 meq/L (ref 135–145)
TOTAL PROTEIN: 7.5 g/dL (ref 6.0–8.3)

## 2016-03-22 LAB — LIPID PANEL
CHOL/HDL RATIO: 5
Cholesterol: 198 mg/dL (ref 0–200)
HDL: 42.2 mg/dL (ref >39.00)
NONHDL: 155.78
TRIGLYCERIDES: 211 mg/dL — AB (ref 0.0–149.0)
VLDL: 42.2 mg/dL — ABNORMAL HIGH (ref 0.0–40.0)

## 2016-03-22 LAB — CBC WITH DIFFERENTIAL/PLATELET
BASOS ABS: 0 10*3/uL (ref 0.0–0.1)
Basophils Relative: 0.6 % (ref 0.0–3.0)
EOS PCT: 4.9 % (ref 0.0–5.0)
Eosinophils Absolute: 0.3 10*3/uL (ref 0.0–0.7)
HEMATOCRIT: 35 % — AB (ref 36.0–49.0)
HEMOGLOBIN: 11.7 g/dL — AB (ref 12.0–16.0)
LYMPHS ABS: 2.4 10*3/uL (ref 0.7–4.0)
Lymphocytes Relative: 40.9 % (ref 24.0–48.0)
MCHC: 33.6 g/dL (ref 31.0–37.0)
MCV: 85.5 fl (ref 78.0–98.0)
MONO ABS: 0.5 10*3/uL (ref 0.1–1.0)
Monocytes Relative: 8.9 % (ref 3.0–12.0)
Neutro Abs: 2.6 10*3/uL (ref 1.4–7.7)
Neutrophils Relative %: 44.7 % (ref 43.0–71.0)
PLATELETS: 279 10*3/uL (ref 150.0–575.0)
RBC: 4.09 Mil/uL (ref 3.80–5.70)
RDW: 14.7 % (ref 11.4–15.5)
WBC: 5.9 10*3/uL (ref 4.5–13.5)

## 2016-03-22 LAB — LDL CHOLESTEROL, DIRECT: Direct LDL: 140 mg/dL

## 2016-03-22 LAB — TSH: TSH: 1.72 u[IU]/mL (ref 0.40–5.00)

## 2016-03-29 ENCOUNTER — Encounter (HOSPITAL_COMMUNITY): Payer: Self-pay | Admitting: *Deleted

## 2016-03-29 ENCOUNTER — Emergency Department (HOSPITAL_COMMUNITY)
Admission: EM | Admit: 2016-03-29 | Discharge: 2016-03-29 | Disposition: A | Discharge status: Home / Self Care | Payer: Managed Care, Other (non HMO) | Attending: Emergency Medicine | Admitting: Emergency Medicine

## 2016-03-29 DIAGNOSIS — R109 Unspecified abdominal pain: Secondary | ICD-10-CM | POA: Diagnosis present

## 2016-03-29 DIAGNOSIS — R1110 Vomiting, unspecified: Secondary | ICD-10-CM | POA: Insufficient documentation

## 2016-03-29 LAB — COMPREHENSIVE METABOLIC PANEL
ALBUMIN: 4.1 g/dL (ref 3.5–5.0)
ALK PHOS: 73 U/L (ref 38–126)
ALT: 18 U/L (ref 14–54)
AST: 19 U/L (ref 15–41)
Anion gap: 11 (ref 5–15)
BILIRUBIN TOTAL: 0.7 mg/dL (ref 0.3–1.2)
BUN: 12 mg/dL (ref 6–20)
CO2: 24 mmol/L (ref 22–32)
CREATININE: 0.79 mg/dL (ref 0.44–1.00)
Calcium: 9.5 mg/dL (ref 8.9–10.3)
Chloride: 103 mmol/L (ref 101–111)
GFR calc Af Amer: >60 mL/min (ref >60)
GLUCOSE: 111 mg/dL — AB (ref 65–99)
Potassium: 3.7 mmol/L (ref 3.5–5.1)
Sodium: 138 mmol/L (ref 135–145)
TOTAL PROTEIN: 7.6 g/dL (ref 6.5–8.1)

## 2016-03-29 LAB — CBC
HCT: 34.8 % — ABNORMAL LOW (ref 36.0–46.0)
HEMOGLOBIN: 11.4 g/dL — AB (ref 12.0–15.0)
MCH: 28 pg (ref 26.0–34.0)
MCHC: 32.8 g/dL (ref 30.0–36.0)
MCV: 85.5 fL (ref 78.0–100.0)
Platelets: 280 10*3/uL (ref 150–400)
RBC: 4.07 MIL/uL (ref 3.87–5.11)
RDW: 14 % (ref 11.5–15.5)
WBC: 7.3 10*3/uL (ref 4.0–10.5)

## 2016-03-29 LAB — LIPASE, BLOOD: Lipase: 20 U/L (ref 11–51)

## 2016-03-29 MED ORDER — ONDANSETRON 4 MG PO TBDP
8.0000 mg | ORAL_TABLET | Freq: Once | ORAL | Status: AC
Start: 2016-03-29 — End: 2016-03-29
  Administered 2016-03-29: 8 mg via ORAL
  Filled 2016-03-29: qty 2

## 2016-03-29 NOTE — ED Notes (Signed)
The pt has been vomiting since 1900 today  Followed by vomiting  lmp April 1st

## 2016-03-29 NOTE — ED Notes (Signed)
Pt states that she is going to go home to sleep it off.

## 2018-03-04 ENCOUNTER — Encounter (HOSPITAL_COMMUNITY): Payer: Self-pay | Admitting: Emergency Medicine

## 2018-03-04 ENCOUNTER — Other Ambulatory Visit: Payer: Self-pay

## 2018-03-04 DIAGNOSIS — F329 Major depressive disorder, single episode, unspecified: Secondary | ICD-10-CM | POA: Diagnosis present

## 2018-03-04 DIAGNOSIS — Z7289 Other problems related to lifestyle: Secondary | ICD-10-CM | POA: Insufficient documentation

## 2018-03-04 DIAGNOSIS — F1220 Cannabis dependence, uncomplicated: Secondary | ICD-10-CM | POA: Diagnosis not present

## 2018-03-04 DIAGNOSIS — F322 Major depressive disorder, single episode, severe without psychotic features: Secondary | ICD-10-CM | POA: Diagnosis not present

## 2018-03-04 DIAGNOSIS — X838XXA Intentional self-harm by other specified means, initial encounter: Secondary | ICD-10-CM | POA: Diagnosis not present

## 2018-03-04 DIAGNOSIS — R45851 Suicidal ideations: Secondary | ICD-10-CM | POA: Insufficient documentation

## 2018-03-04 LAB — RAPID URINE DRUG SCREEN, HOSP PERFORMED
Amphetamines: NOT DETECTED
BARBITURATES: NOT DETECTED
BENZODIAZEPINES: NOT DETECTED
Cocaine: NOT DETECTED
Opiates: NOT DETECTED
Tetrahydrocannabinol: NOT DETECTED

## 2018-03-04 LAB — CBC
HEMATOCRIT: 37 % (ref 36.0–46.0)
Hemoglobin: 12 g/dL (ref 12.0–15.0)
MCH: 28.5 pg (ref 26.0–34.0)
MCHC: 32.4 g/dL (ref 30.0–36.0)
MCV: 87.9 fL (ref 78.0–100.0)
Platelets: 323 10*3/uL (ref 150–400)
RBC: 4.21 MIL/uL (ref 3.87–5.11)
RDW: 14.4 % (ref 11.5–15.5)
WBC: 7.4 10*3/uL (ref 4.0–10.5)

## 2018-03-04 LAB — COMPREHENSIVE METABOLIC PANEL
ALBUMIN: 4.1 g/dL (ref 3.5–5.0)
ALK PHOS: 71 U/L (ref 38–126)
ALT: 16 U/L (ref 14–54)
AST: 19 U/L (ref 15–41)
Anion gap: 13 (ref 5–15)
BILIRUBIN TOTAL: 0.5 mg/dL (ref 0.3–1.2)
BUN: 13 mg/dL (ref 6–20)
CALCIUM: 9.4 mg/dL (ref 8.9–10.3)
CO2: 21 mmol/L — ABNORMAL LOW (ref 22–32)
CREATININE: 0.69 mg/dL (ref 0.44–1.00)
Chloride: 104 mmol/L (ref 101–111)
GFR calc Af Amer: >60 mL/min (ref >60)
GLUCOSE: 108 mg/dL — AB (ref 65–99)
Potassium: 3.6 mmol/L (ref 3.5–5.1)
Sodium: 138 mmol/L (ref 135–145)
TOTAL PROTEIN: 7.8 g/dL (ref 6.5–8.1)

## 2018-03-04 LAB — ACETAMINOPHEN LEVEL: Acetaminophen (Tylenol), Serum: <10 ug/mL — ABNORMAL LOW (ref 10–30)

## 2018-03-04 LAB — SALICYLATE LEVEL: Salicylate Lvl: <7 mg/dL (ref 2.8–30.0)

## 2018-03-04 LAB — I-STAT BETA HCG BLOOD, ED (MC, WL, AP ONLY)

## 2018-03-04 LAB — ETHANOL: Alcohol, Ethyl (B): 132 mg/dL — ABNORMAL HIGH (ref <10)

## 2018-03-04 NOTE — ED Triage Notes (Signed)
Brought by mom for psych eval.  Per mother patient has been "incoherent".  Noted to be laughing in triage innappropriately.  Reports cutting herself with a box cutter.  Multiple superficial cuts to left upper arm and to upper thighs.  States it made me feel good like when I used to cut myself years ago.  Does endorse feeling depressed.  When asked if suicidal states my family is the only reason i'm here.  Endorses using alcohol and marijuana today.

## 2018-03-04 NOTE — ED Notes (Signed)
Pt taken strait back to triage.

## 2018-03-04 NOTE — ED Notes (Signed)
Called for sitter.  Added to the list.

## 2018-03-05 ENCOUNTER — Encounter (HOSPITAL_COMMUNITY): Payer: Self-pay | Admitting: *Deleted

## 2018-03-05 ENCOUNTER — Emergency Department (HOSPITAL_COMMUNITY)
Admission: EM | Admit: 2018-03-05 | Discharge: 2018-03-05 | Disposition: A | Discharge status: Other Acute Inpatient Hospital | Payer: Managed Care, Other (non HMO) | Attending: Emergency Medicine | Admitting: Emergency Medicine

## 2018-03-05 ENCOUNTER — Other Ambulatory Visit: Payer: Self-pay

## 2018-03-05 ENCOUNTER — Inpatient Hospital Stay (HOSPITAL_COMMUNITY)
Admission: AD | Admit: 2018-03-05 | Discharge: 2018-03-08 | DRG: 885 | Disposition: A | Discharge status: Home / Self Care | Payer: 59 | Source: Intra-hospital | Attending: Psychiatry | Admitting: Psychiatry

## 2018-03-05 DIAGNOSIS — G4700 Insomnia, unspecified: Secondary | ICD-10-CM | POA: Diagnosis present

## 2018-03-05 DIAGNOSIS — F410 Panic disorder [episodic paroxysmal anxiety] without agoraphobia: Secondary | ICD-10-CM

## 2018-03-05 DIAGNOSIS — F419 Anxiety disorder, unspecified: Secondary | ICD-10-CM | POA: Diagnosis present

## 2018-03-05 DIAGNOSIS — F1290 Cannabis use, unspecified, uncomplicated: Secondary | ICD-10-CM | POA: Diagnosis not present

## 2018-03-05 DIAGNOSIS — R450 Nervousness: Secondary | ICD-10-CM | POA: Diagnosis not present

## 2018-03-05 DIAGNOSIS — F1099 Alcohol use, unspecified with unspecified alcohol-induced disorder: Secondary | ICD-10-CM | POA: Diagnosis not present

## 2018-03-05 DIAGNOSIS — Z915 Personal history of self-harm: Secondary | ICD-10-CM

## 2018-03-05 DIAGNOSIS — F332 Major depressive disorder, recurrent severe without psychotic features: Secondary | ICD-10-CM | POA: Diagnosis present

## 2018-03-05 DIAGNOSIS — F322 Major depressive disorder, single episode, severe without psychotic features: Secondary | ICD-10-CM | POA: Diagnosis not present

## 2018-03-05 DIAGNOSIS — F329 Major depressive disorder, single episode, unspecified: Secondary | ICD-10-CM

## 2018-03-05 DIAGNOSIS — F32A Depression, unspecified: Secondary | ICD-10-CM

## 2018-03-05 DIAGNOSIS — Z7289 Other problems related to lifestyle: Secondary | ICD-10-CM

## 2018-03-05 HISTORY — DX: Major depressive disorder, single episode, unspecified: F32.9

## 2018-03-05 HISTORY — DX: Anxiety disorder, unspecified: F41.9

## 2018-03-05 HISTORY — DX: Depression, unspecified: F32.A

## 2018-03-05 MED ORDER — ALUM & MAG HYDROXIDE-SIMETH 200-200-20 MG/5ML PO SUSP: 30.0000 mL | ORAL | Status: DC | PRN

## 2018-03-05 MED ORDER — IBUPROFEN 800 MG PO TABS
800.0000 mg | ORAL_TABLET | Freq: Once | ORAL | Status: AC
  Administered 2018-03-05: 800 mg via ORAL
  Filled 2018-03-05: qty 1

## 2018-03-05 MED ORDER — MAGNESIUM HYDROXIDE 400 MG/5ML PO SUSP: 30.0000 mL | Freq: Every day | ORAL | Status: DC | PRN

## 2018-03-05 MED ORDER — TRAZODONE HCL 50 MG PO TABS
50.0000 mg | ORAL_TABLET | Freq: Every evening | ORAL | Status: DC | PRN
  Administered 2018-03-05 – 2018-03-07 (×3): 50 mg via ORAL
  Filled 2018-03-05 (×3): qty 1

## 2018-03-05 MED ORDER — HYDROXYZINE HCL 25 MG PO TABS
25.0000 mg | ORAL_TABLET | Freq: Three times a day (TID) | ORAL | Status: DC | PRN
  Administered 2018-03-05 – 2018-03-07 (×4): 25 mg via ORAL
  Filled 2018-03-05 (×4): qty 1

## 2018-03-05 MED ORDER — SERTRALINE HCL 25 MG PO TABS
25.0000 mg | ORAL_TABLET | Freq: Every day | ORAL | Status: DC
  Administered 2018-03-05 – 2018-03-06 (×2): 25 mg via ORAL
  Filled 2018-03-05 (×3): qty 1

## 2018-03-05 MED ORDER — ONDANSETRON 4 MG PO TBDP
4.0000 mg | ORAL_TABLET | Freq: Once | ORAL | Status: AC
  Administered 2018-03-05: 4 mg via ORAL
  Filled 2018-03-05: qty 1

## 2018-03-05 MED ORDER — ACETAMINOPHEN 325 MG PO TABS: 650.0000 mg | ORAL_TABLET | Freq: Four times a day (QID) | ORAL | Status: DC | PRN

## 2018-03-05 MED ORDER — BACITRACIN-NEOMYCIN-POLYMYXIN OINTMENT TUBE
TOPICAL_OINTMENT | Freq: Three times a day (TID) | CUTANEOUS | Status: DC
  Administered 2018-03-05 – 2018-03-08 (×8): via TOPICAL
  Filled 2018-03-05: qty 1
  Filled 2018-03-05: qty 14.17

## 2018-03-05 NOTE — ED Provider Notes (Signed)
[facility] EMERGENCY [facility] Provider Note   CSN: [id number] Arrival date & time: 03/04/18  2227     History   Chief Complaint Chief Complaint  Patient presents with  . Psychiatric Evaluation    HPI Debbie Alvarez is a 22 y.o. female.  The history is provided by the patient and medical records.    22 year old female presenting to the ED for psychiatric relation.  Patient states she has been feeling depressed for quite some time but did not reach out to anyone about this until last week.  States there is not anything necessarily that is going poorly or causing her great stress, she is just generally unsatisfied with her life but she is not sure why.  States all she does is lay in her bed and sleep lately.  States she has no desire to get up and do anything.  States she has been canceling on her friends and has been very antisocial lately.  States she did talk to her mom about this last week who arrange for her to have a consultation with psychiatry this past Thursday, 3 days ago.  This overall went well and she was due to start outpatient therapy this coming week.  Tonight she feels like everything "boiled over" and she just needed to feel something other than side.  She took a box cutter and started cutting her arms and legs with it.  States at the time she was thinking about killing herself as it would likely be easier than living the way she was living.  States now she just feels numb because she drank a lot of alcohol and smoke week.  When asked if she currently still feels suicidal, she states yes and no.  States yes in the sense that she would rather not be here, but no because she simply wants to go home.  States she knows if she talks about how she really feels she will not be able to go home.  She denies any homicidal ideation.  No hallucinations.  She has never been on medications for depression or anxiety in the past.  Past Medical History:  Diagnosis Date    . Tonsillar and adenoid hypertrophy 11/2014   snores during sleep, mother denies apnea    Patient Active Problem List   Diagnosis Date Noted  . Premenstrual syndrome 01/28/2015    Past Surgical History:  Procedure Laterality Date  . NO PAST SURGERIES    . TONSILLECTOMY/ADENOIDECTOMY/TURBINATE REDUCTION N/A 12/01/2014   Procedure: TONSILLECTOMY/ADENOIDECTOMY/TURBINATE REDUCTION;  Surgeon: Drema Halonhristopher E Newman, MD;  Location: [facility];  Service: ENT;  Laterality: N/A;     OB History   None      Home Medications    Prior to Admission medications   Not on File    Family History Family History  Problem Relation Age of Onset  . Hypertension Maternal Grandmother     Social History Social History   Tobacco Use  . Smoking status: Never Smoker  . Smokeless tobacco: Never Used  Substance Use Topics  . Alcohol use: No  . Drug use: No     Allergies   Patient has no known allergies.   Review of Systems Review of Systems  Psychiatric/Behavioral: Positive for self-injury and suicidal ideas.  All other systems reviewed and are negative.    Physical Exam Updated Vital Signs BP 133/82 (BP Location: Right Arm)   Pulse 96   Temp (!) 97.5 F (36.4 C) (Oral)  Resp 18   Ht 5' 3.5" (1.613 m)   Wt 106.6 kg (235 lb)   SpO2 100%   BMI 40.98 kg/m   Physical Exam  Constitutional: She is oriented to person, place, and time. She appears well-developed and well-nourished.  HENT:  Head: Normocephalic and atraumatic.  Mouth/Throat: Oropharynx is clear and moist.  Eyes: Pupils are equal, round, and reactive to light. Conjunctivae and EOM are normal.  Neck: Normal range of motion.  Cardiovascular: Normal rate, regular rhythm and normal heart sounds.  Pulmonary/Chest: Effort normal and breath sounds normal. No stridor. No respiratory distress.  Abdominal: Soft. Bowel sounds are normal. There is no tenderness. There is no rebound.  Musculoskeletal: Normal  range of motion.  Superficial abrasions noted to left arm and bilateral anterior thighs; there is no open wound or active bleeding; no signs of infection  Neurological: She is alert and oriented to person, place, and time.  Skin: Skin is warm and dry.  Psychiatric: She exhibits a depressed mood.  Tearful, depressed; admits to SI with plan to cut herself; denies HI/AVH  Nursing note and vitals reviewed.    ED Treatments / Results  Labs (all labs ordered are listed, but only abnormal results are displayed) Labs Reviewed  COMPREHENSIVE METABOLIC PANEL - Abnormal; Notable for the following components:      Result Value   CO2 21 (*)    Glucose, Bld 108 (*)    All other components within normal limits  ETHANOL - Abnormal; Notable for the following components:   Alcohol, Ethyl (B) 132 (*)    All other components within normal limits  ACETAMINOPHEN LEVEL - Abnormal; Notable for the following components:   Acetaminophen (Tylenol), Serum <10 (*)    All other components within normal limits  SALICYLATE LEVEL  CBC  RAPID URINE DRUG SCREEN, HOSP PERFORMED  I-STAT BETA HCG BLOOD, ED ([facility], [facility], [facility] ONLY)    EKG None  Radiology No results found.  Procedures Procedures (including critical care time)  Medications Ordered in ED Medications - No data to display   Initial Impression / Assessment and Plan / ED Course  I have reviewed the triage vital signs and the nursing notes.  Pertinent labs & imaging results that were available during my care of the patient were reviewed by me and considered in my medical decision making (see chart for details).  22 y.o. F here with increased depression and now self harm.  She has superficial abrasions/lacerations to left arm and bilateral anterior thighs.  These do not require repair.  Her tetanus is up to date.  Patient still has thoughts of self harm on presentation.  She does appear remorseful for her actions, however still is very withdrawn and  appears depressed.  Screening labs overall reassuring-- ethanol 132, UDS negative but does admit to smoking marijuana earlier this evening.  Will get TTS consult.  TTS has evaluated, recommends IP treatment and bed available at [facility] this morning after 7am.  I agree with decision for IP treatment.  Patient and family have been updated.  Final Clinical Impressions(s) / ED Diagnoses   Final diagnoses:  Depression, unspecified depression type  Self-mutilation    ED Discharge Orders    None       Garlon Hatchet, PA-C 03/05/18 0555    Palumbo, April, MD 03/05/18 [phone]

## 2018-03-05 NOTE — Tx Team (Signed)
Initial Treatment Plan 03/05/2018 3:51 PM Debbie HanCalyssa D Signorelli [id number]    PATIENT STRESSORS: Educational concerns Financial difficulties Medication change or noncompliance Occupational concerns Substance abuse   PATIENT STRENGTHS: Ability for insight Active sense of humor Average or above average intelligence Capable of independent living MetallurgistCommunication skills Financial means General fund of knowledge Motivation for treatment/growth Physical Health Supportive family/friends   PATIENT IDENTIFIED PROBLEMS: "substance abuse"  "suicidal thoughts"  "anxiety"  "depression"               DISCHARGE CRITERIA:  Ability to meet basic life and health needs Adequate post-discharge living arrangements Improved stabilization in mood, thinking, and/or behavior Medical problems require only outpatient monitoring Motivation to continue treatment in a less acute level of care Need for constant or close observation no longer present Reduction of life-threatening or endangering symptoms to within safe limits Safe-care adequate arrangements made Verbal commitment to aftercare and medication compliance Withdrawal symptoms are absent or subacute and managed without 24-hour nursing intervention  PRELIMINARY DISCHARGE PLAN: Attend aftercare/continuing care group Attend PHP/IOP Attend 12-step recovery group Outpatient therapy Participate in family therapy Return to previous living arrangement Return to previous work or school arrangements  PATIENT/FAMILY INVOLVEMENT: This treatment plan has been presented to and reviewed with the patient, Debbie Alvarez.  The patient and family have been given the opportunity to ask questions and make suggestions.  Earline MayotteKnight, Aurora Rody Shephard, RN 03/05/2018, 3:51 PM

## 2018-03-05 NOTE — ED Notes (Signed)
Breakfast tray ordered 

## 2018-03-05 NOTE — [facility] Suicide Risk Assessment (Signed)
[facility]   Nursing information obtained from:    Demographic factors:    Current Mental Status:    Loss Factors:    Historical Factors:    Risk Reduction Factors:     Total Time spent with patient: 45 minutes Principal Problem: <principal problem not specified> Diagnosis:   Patient Active Problem List   Diagnosis Date Noted  . Severe recurrent major depression without psychotic features (HCC) [F33.2] 03/05/2018  . Premenstrual syndrome [N94.3] 01/28/2015   Subjective Data: Patient seen and examined.  Patient is a 22 year old female who [facility] accompanied by her mother and father who is brought in because of suicidal ideation.  The patient stated that she felt as though over the last month or so she had been getting depressed and anxious.  She stated that she had been affected by depression for many years, but it never really sought help.  She could point to spring vacation when she was driving to the meet a cousin for vacation, and she had her first panic attack.  She stated that she pulled over and lost consciousness.  That had not happened before.  She studies psychology in college, and tends to use a lot of jargon.  She stated that last night she began to feel significant anxiety, and started to cut herself on her upper left arm and legs.  She stated she had not cut herself since high school.  She had cut back then.  She sent a text to her family saying that she did cut herself.  Her roommates came in and saw her, and contacted her mother.  They stated that she was acting "incoherently".  She was brought to the hospital for evaluation and stabilization.  She admitted to crying spells, social withdrawal, loss of interest in usual measures, fatigue, irritability, decreased concentration, increased sleep, erratic appetite and feelings of guilt and hopelessness.  The patient did admit to daily use of marijuana and occasional  alcohol use.  Her blood alcohol on admission was 1 3200 but her drug screen was negative.  She was admitted to the hospital for evaluation and stabilization.  Continued Clinical Symptoms:  Alcohol Use Disorder Identification Test Final Score (AUDIT): 3 The "Alcohol Use Disorders Identification Test", Guidelines for Use in Primary Care, Second Edition.  World Science writerHealth Organization [facility](WHO). Score between 0-7:  no or low risk or alcohol related problems. Score between 8-15:  moderate risk of alcohol related problems. Score between 16-19:  high risk of alcohol related problems. Score 20 or above:  warrants further diagnostic evaluation for alcohol dependence and treatment.   CLINICAL FACTORS:   Severe Anxiety and/or Agitation Panic Attacks Depression:   Anhedonia Comorbid alcohol abuse/dependence Hopelessness Impulsivity Dysthymia Personality Disorders:   Cluster B More than one psychiatric diagnosis   Musculoskeletal: Strength & Muscle Tone: within normal limits Gait & Station: normal Patient leans: N/A  Psychiatric Specialty Exam: Physical Exam  Constitutional: She is oriented to person, place, and time. She appears well-developed and well-nourished.  HENT:  Head: Normocephalic and atraumatic.  Respiratory: Effort normal.  Neurological: She is alert and oriented to person, place, and time.    Review of Systems  Psychiatric/Behavioral: Positive for depression, substance abuse and suicidal ideas. The patient is nervous/anxious and has insomnia.     Blood pressure 114/64, pulse 75, temperature 98.2 F (36.8 C), temperature source Oral, resp. rate 18, height 5' 4.4" (1.636 m), weight 102.5 kg (226 lb), last menstrual  period 02/26/2018, SpO2 98 %.Body mass index is 38.31 kg/m.  General Appearance: Disheveled  Eye Contact:  Minimal  Speech:  Normal Rate  Volume:  Decreased  Mood:  Depressed  Affect:  Congruent  Thought Process:  Coherent  Orientation:  Full (Time, Place, and  Person)  Thought Content:  Logical  Suicidal Thoughts:  Yes.  without intent/plan  Homicidal Thoughts:  No  Memory:  Immediate;   Fair  Judgement:  Intact  Insight:  Lacking  Psychomotor Activity:  Psychomotor Retardation  Concentration:  Concentration: Fair  Recall:  Fair  Fund of Knowledge:  Good  Language:  Good  Akathisia:  No  Handed:  Right  AIMS (if indicated):     Assets:  Desire for Improvement Financial Resources/Insurance Housing Social Support  ADL's:  Intact  Cognition:  WNL  Sleep:         COGNITIVE FEATURES THAT CONTRIBUTE TO RISK:  None    SUICIDE RISK:   Mild:  Suicidal ideation of limited frequency, intensity, duration, and specificity.  There are no identifiable plans, no associated intent, mild dysphoria and related symptoms, good self-control (both objective and subjective assessment), few other risk factors, and identifiable protective factors, including available and accessible social support.  PLAN OF CARE: Patient is seen and examined.  Patient is a 22 year old female with a self-reported history of anxiety, depression, panic with worsening symptoms over the last month or 2.  She describes having the symptoms for many years.  She had previously cut in high school, but had not until last night.  She has been using marijuana daily in an attempt to numb herself.  She has multiple his symptoms of depression including helplessness, hopelessness, worthlessness and suicidal ideation.  She will be admitted to the psychiatric unit.  She will be started on medication.  She will be integrated into the milieu.  She will receive psychosocial intervention for coping skills and substance use.  I certify that inpatient services furnished can reasonably be expected to improve the patient's condition.   Antonieta Pert, MD 03/05/2018, 2:03 PM

## 2018-03-05 NOTE — ED Notes (Signed)
Pt changed into burgundy scrubs and wounds coated with bacitracin

## 2018-03-05 NOTE — H&P (Signed)
Psychiatric Admission Assessment Adult  Patient Identification: Debbie Alvarez MRN:  [id number] Date of Evaluation:  03/05/2018 Chief Complaint:  MDD SINGLE EPISODE SEVERE WITHOUT PSYCHOTIC FEATURES Principal Diagnosis: <principal problem not specified> Diagnosis:   Patient Active Problem List   Diagnosis Date Noted  . Severe recurrent major depression without psychotic features ([place]) [F33.2] 03/05/2018  . Premenstrual syndrome [N94.3] 01/28/2015   History of Present Illness: Patient is seen and examined.  Patient is a 22 year old female with a self-reported history of depression for several years who was brought to the emergency department for evaluation.  The patient stated that over the last several months her depression is worsened, and she is also developed panic attacks.  This occurred approximately 1-2 months ago.  She stated she had been depressed for many years of her life but it never sought help.  Yesterday she felt as though her symptoms had worsened.  She began to cut herself again.  She did not cut herself since she had been in high school.  She stated she just felt numb.  Her blood alcohol on admission was also 132.  Her roommates notified her parents, and she also texted her parents about the cutting.  They took her to the emergency room at [facility].  She denied any previous therapy or counseling.  She denied a history of sexual, emotional or physical trauma.  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  She admitted to crying spells, helplessness, hopelessness and worthlessness.  She denied any previous suicide attempts.  She stated 1 of the instigating factors around the ring break was going to visit a cousin and suffering her first panic attack.  She describes panic symptoms including syncope.  She was admitted to the hospital for evaluation and stabilization. Associated Signs/Symptoms: Depression Symptoms:   depressed mood, anhedonia, insomnia, psychomotor retardation, fatigue, feelings of worthlessness/guilt, difficulty concentrating, hopelessness, suicidal thoughts without plan, anxiety, panic attacks, loss of energy/fatigue, disturbed sleep, weight gain, (Hypo) Manic Symptoms:  Impulsivity, Anxiety Symptoms:  Excessive Worry, Psychotic Symptoms:  Denied PTSD Symptoms: Negative Total Time spent with patient: 45 minutes  Past Psychiatric History: Patient denied any formal past psychiatric history.  No admissions, no therapy.  Is the patient at risk to self? Yes.    Has the patient been a risk to self in the past 6 months? Yes.    Has the patient been a risk to self within the distant past? Yes.    Is the patient a risk to others? No.  Has the patient been a risk to others in the past 6 months? No.  Has the patient been a risk to others within the distant past? No.   Prior Inpatient Therapy:   Prior Outpatient Therapy:    Alcohol Screening: 1. How often do you have a drink containing alcohol?: 2 to 4 times a month 2. How many drinks containing alcohol do you have on a typical day when you are drinking?: 3 or 4 3. How often do you have six or more drinks on one occasion?: Never AUDIT-C Score: 3 4. How often during the last year have you found that you were not able to stop drinking once you had started?: Never 5. How often during the last year have you failed to do what was normally expected from you becasue of drinking?: Never 6. How often during the last year have you needed a first drink in  the morning to get yourself going after a heavy drinking session?: Never 7. How often during the last year have you had a feeling of guilt of remorse after drinking?: Never 8. How often during the last year have you been unable to remember what happened the night before because you had been drinking?: Never 9. Have you or someone else been injured as a result of your drinking?: No 10. Has  a relative or friend or a doctor or another health worker been concerned about your drinking or suggested you cut down?: No Alcohol Use Disorder Identification Test Final Score (AUDIT): 3 Intervention/Follow-up: Alcohol Education, AUDIT Score <7 follow-up not indicated Substance Abuse History in the last 12 months:  Yes.   Consequences of Substance Abuse: Negative Previous Psychotropic Medications: No  Psychological Evaluations: No  Past Medical History:  Past Medical History:  Diagnosis Date  . Anxiety   . Depression   . Tonsillar and adenoid hypertrophy 11/2014   snores during sleep, mother denies apnea    Past Surgical History:  Procedure Laterality Date  . NO PAST SURGERIES    . TONSILLECTOMY/ADENOIDECTOMY/TURBINATE REDUCTION N/A 12/01/2014   Procedure: TONSILLECTOMY/ADENOIDECTOMY/TURBINATE REDUCTION;  Surgeon: Rozetta Nunnery, MD;  Location: [facility];  Service: ENT;  Laterality: N/A;   Family History:  Family History  Problem Relation Age of Onset  . Hypertension Maternal Grandmother    Family Psychiatric  History: She has a cousin with similar symptoms Tobacco Screening: Have you used any form of tobacco in the last 30 days? (Cigarettes, Smokeless Tobacco, Cigars, and/or Pipes): No Social History:  Social History   Substance and Sexual Activity  Alcohol Use Yes  . Alcohol/week: 0.6 oz  . Types: 1 Cans of beer per week   Comment: one beer every other week     Social History   Substance and Sexual Activity  Drug Use Yes  . Types: Marijuana   Comment: couple times a day recently    Additional Social History:      Pain Medications: see MAR Prescriptions: see MAR Over the Counter: se MAR History of alcohol / drug use?: Yes Longest period of sobriety (when/how long): unsure Negative Consequences of Use: Financial, Work / School Withdrawal Symptoms: Other (Comment)(anxiety, depression) Name of Substance 1: alcohol 1 - Age of First Use: 57  ys old 1 - Amount (size/oz): one beer every other week 1 - Frequency: infrequently 1 - Duration: ongoing 1 - Last Use / Amount: 03/04/2018 Name of Substance 2: THC 2 - Age of First Use: 22 yrs old 2 - Amount (size/oz): couple times a day 2 - Frequency: daily 2 - Duration: ongoing 2 - Last Use / Amount: 03/04/2018                Allergies:  No Known Allergies Lab Results:  Results for orders placed or performed during the hospital encounter of 03/05/18 (from the past 48 hour(s))  Rapid urine drug screen (hospital performed)     Status: None   Collection Time: 03/04/18 10:52 PM  Result Value Ref Range   Opiates NONE DETECTED NONE DETECTED   Cocaine NONE DETECTED NONE DETECTED   Benzodiazepines NONE DETECTED NONE DETECTED   Amphetamines NONE DETECTED NONE DETECTED   Tetrahydrocannabinol NONE DETECTED NONE DETECTED   Barbiturates NONE DETECTED NONE DETECTED    Comment: (NOTE) DRUG SCREEN FOR MEDICAL PURPOSES ONLY.  IF CONFIRMATION IS NEEDED FOR ANY PURPOSE, NOTIFY LAB WITHIN 5 DAYS. LOWEST DETECTABLE LIMITS FOR URINE DRUG SCREEN  Drug Class                     Cutoff (ng/mL) Amphetamine and metabolites    1000 Barbiturate and metabolites    200 Benzodiazepine                 294 Tricyclics and metabolites     300 Opiates and metabolites        300 Cocaine and metabolites        300 THC                            50 Performed at [facility], [place] [address], [place], [place] [address]   Comprehensive metabolic panel     Status: Abnormal   Collection Time: 03/04/18 10:53 PM  Result Value Ref Range   Sodium 138 135 - 145 mmol/L   Potassium 3.6 3.5 - 5.1 mmol/L   Chloride 104 101 - 111 mmol/L   CO2 21 (L) 22 - 32 mmol/L   Glucose, Bld 108 (H) 65 - 99 mg/dL   BUN 13 6 - 20 mg/dL   Creatinine, Ser 0.69 0.44 - 1.00 mg/dL   Calcium 9.4 8.9 - 10.3 mg/dL   Total Protein 7.8 6.5 - 8.1 g/dL   Albumin 4.1 3.5 - 5.0 g/dL   AST 19 15 - 41 U/L   ALT 16 14 - 54 U/L    Alkaline Phosphatase 71 38 - 126 U/L   Total Bilirubin 0.5 0.3 - 1.2 mg/dL   GFR calc non Af Amer >60 >60 mL/min   GFR calc Af Amer >60 >60 mL/min    Comment: (NOTE) The eGFR has been calculated using the CKD EPI equation. This calculation has not been validated in all clinical situations. eGFR's persistently <60 mL/min signify possible Chronic Kidney Disease.    Anion gap 13 5 - 15    Comment: Performed at [facility] [address] [place], [place] [address]  Ethanol     Status: Abnormal   Collection Time: 03/04/18 10:53 PM  Result Value Ref Range   Alcohol, Ethyl (B) 132 (H) <10 mg/dL    Comment:        LOWEST DETECTABLE LIMIT FOR SERUM ALCOHOL IS 10 mg/dL FOR MEDICAL PURPOSES ONLY Performed at [facility], [place] [address] [place], [place] [address]   Salicylate level     Status: None   Collection Time: 03/04/18 10:53 PM  Result Value Ref Range   Salicylate Lvl <2.7 2.8 - 30.0 mg/dL    Comment: Performed at [facility] [address] [place], [place] [address]  Acetaminophen level     Status: Abnormal   Collection Time: 03/04/18 10:53 PM  Result Value Ref Range   Acetaminophen (Tylenol), Serum <10 (L) 10 - 30 ug/mL    Comment:        THERAPEUTIC CONCENTRATIONS VARY SIGNIFICANTLY. A RANGE OF 10-30 ug/mL MAY BE AN EFFECTIVE CONCENTRATION FOR MANY PATIENTS. HOWEVER, SOME ARE BEST TREATED AT CONCENTRATIONS OUTSIDE THIS RANGE. ACETAMINOPHEN CONCENTRATIONS >150 ug/mL AT 4 HOURS AFTER INGESTION AND >50 ug/mL AT 12 HOURS AFTER INGESTION ARE OFTEN ASSOCIATED WITH TOXIC REACTIONS. Performed at [facility], [place] [address] [place],  [address]   cbc     Status: None   Collection Time: 03/04/18 10:53 PM  Result Value Ref Range   WBC 7.4 4.0 - 10.5 K/uL  RBC 4.21 3.87 - 5.11 MIL/uL   Hemoglobin 12.0 12.0 - 15.0 g/dL   HCT 37.0 36.0 - 46.0 %   MCV 87.9 78.0 - 100.0 fL   MCH 28.5 26.0 - 34.0 pg   MCHC 32.4 30.0 - 36.0 g/dL   RDW 14.4  11.5 - 15.5 %   Platelets 323 150 - 400 K/uL    Comment: Performed at [facility], [place] [address] [place], [place] [address]  I-Stat beta hCG blood, ED     Status: None   Collection Time: 03/04/18 11:07 PM  Result Value Ref Range   I-stat hCG, quantitative <5.0 <5 mIU/mL   Comment 3            Comment:   GEST. AGE      CONC.  (mIU/mL)   <=1 WEEK        5 - 50     2 WEEKS       50 - 500     3 WEEKS       100 - 10,000     4 WEEKS     1,000 - 30,000        FEMALE AND NON-PREGNANT FEMALE:     LESS THAN 5 mIU/mL     Blood Alcohol level:  Lab Results  Component Value Date   ETH 132 (H) 86/75/4492    Metabolic Disorder Labs:  No results found for: HGBA1C, MPG No results found for: PROLACTIN Lab Results  Component Value Date   CHOL 198 03/21/2016   TRIG 211.0 (H) 03/21/2016   HDL 42.20 03/21/2016   CHOLHDL 5 03/21/2016   VLDL 42.2 (H) 03/21/2016   LDLCALC 132 (H) 03/16/2015    Current Medications: Current Facility-Administered Medications  Medication Dose Route Frequency Provider Last Rate Last Dose  . acetaminophen (TYLENOL) tablet 650 mg  650 mg Oral Q6H PRN Rozetta Nunnery, NP      . alum & mag hydroxide-simeth (MAALOX/MYLANTA) 200-200-20 MG/5ML suspension 30 mL  30 mL Oral Q4H PRN Lindon Romp A, NP      . hydrOXYzine (ATARAX/VISTARIL) tablet 25 mg  25 mg Oral TID PRN Lindon Romp A, NP      . magnesium hydroxide (MILK OF MAGNESIA) suspension 30 mL  30 mL Oral Daily PRN Lindon Romp A, NP      . neomycin-bacitracin-polymyxin (NEOSPORIN) ointment   Topical TID Sharma Covert, MD      . sertraline (ZOLOFT) tablet 25 mg  25 mg Oral Daily Sharma Covert, MD      . traZODone (DESYREL) tablet 50 mg  50 mg Oral QHS PRN Rozetta Nunnery, NP       PTA Medications: No medications prior to admission.    Musculoskeletal: Strength & Muscle Tone: within normal limits Gait & Station: normal Patient leans: N/A  Psychiatric Specialty Exam: Physical Exam   Constitutional: She appears well-developed and well-nourished.  HENT:  Head: Atraumatic.  Respiratory: Effort normal.  Neurological: She is alert.  Skin: There is erythema.    ROS  Blood pressure 114/64, pulse 75, temperature 98.2 F (36.8 C), temperature source Oral, resp. rate 18, height 5' 4.4" (1.636 m), weight 102.5 kg (226 lb), last menstrual period 02/26/2018, SpO2 98 %.Body mass index is 38.31 kg/m.  General Appearance: Disheveled  Eye Contact:  Minimal  Speech:  Slow  Volume:  Decreased  Mood:  Depressed  Affect:  Congruent  Thought Process:  Coherent  Orientation:  Full (Time, Place,  and Person)  Thought Content:  Logical  Suicidal Thoughts:  No  Homicidal Thoughts:  No  Memory:  Immediate;   Fair  Judgement:  Impaired  Insight:  Lacking  Psychomotor Activity:  Psychomotor Retardation  Concentration:  Concentration: Fair  Recall:  [place] of Knowledge:  Good  Language:  Good  Akathisia:  No  Handed:  Right  AIMS (if indicated):     Assets:  Desire for Improvement Housing Physical Health Resilience Social Support  ADL's:  Intact  Cognition:  WNL  Sleep:       Treatment Plan Summary: Daily contact with patient to assess and evaluate symptoms and progress in treatment, Medication management and Plan Patient is seen and examined.  Patient is a 22 year old female with with a negative formal past psychiatric history but a multiyear history of depression and anxiety.  She was cutting herself yesterday and discovered by her roommates, and then parents were notified.  They brought her to the emergency room.  She clearly is depressed.  She endorses multiple symptoms of depression.  She is also having anxiety and panic attacks.  She has been smoking marijuana daily, and as well her blood alcohol on admission was 132.  We will admit her to the hospital.  We will also integrate her to the unit.  She will get 15-minute checks for suicidality as well as withdrawal  symptoms.  She will be started on Zoloft 25 mg p.o. daily.  She has hydroxyzine available for her as needed for anxiety.  We will integrate her into the milieu.  We will intervene with psychosocial components through groups, staff.  We will get collateral information from her parents and roommates if at all possible.  Hopefully we can get her mood going in the right direction.  Observation Level/Precautions:  15 minute checks  Laboratory:  CBC Chemistry Profile HCG  Psychotherapy:    Medications:    Consultations:    Discharge Concerns:    Estimated LOS:  Other:     Physician Treatment Plan for Primary Diagnosis: <principal problem not specified> Long Term Goal(s): Improvement in symptoms so as ready for discharge  Short Term Goals: Ability to identify changes in lifestyle to reduce recurrence of condition will improve, Ability to verbalize feelings will improve, Ability to disclose and discuss suicidal ideas, Ability to demonstrate self-control will improve, Ability to identify and develop effective coping behaviors will improve, Ability to maintain clinical measurements within normal limits will improve, Compliance with prescribed medications will improve and Ability to identify triggers associated with substance abuse/mental health issues will improve  Physician Treatment Plan for Secondary Diagnosis: Active Problems:   Severe recurrent major depression without psychotic features ([place])  Long Term Goal(s): Improvement in symptoms so as ready for discharge  Short Term Goals: Ability to identify changes in lifestyle to reduce recurrence of condition will improve, Ability to verbalize feelings will improve, Ability to disclose and discuss suicidal ideas, Ability to demonstrate self-control will improve, Ability to identify and develop effective coping behaviors will improve, Ability to maintain clinical measurements within normal limits will improve, Compliance with prescribed medications will  improve and Ability to identify triggers associated with substance abuse/mental health issues will improve  I certify that inpatient services furnished can reasonably be expected to improve the patient's condition.    Sharma Covert, MD 4/1/20192:10 PM

## 2018-03-05 NOTE — Progress Notes (Signed)
[facility], age 22 Y[facility] old, voluntary.  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  Patient brought patient to ER [facility] last night.  Patient has cuts on bilateral arms and bilateral legs.  Numerous tattoos on arms, legs, upper chest, back of neck, pierced ears.  History of cutting in high school and then recently started cutting again.  Patient's parents find out she was cutting and smoking and [facility].  Wears contacs.  Wisdom teeth extracted last year.  Drinks one beer every other week.  Started using THC at age 22 yrs old, uses THC couple times daily.  Does not have any scheduled home medications.  [facility] Andree [facility], [facility].  Never married, no children.  SI thoughts to cut herself.  Denied HI.  Rated depression and anxiety #9, hopeless 8.  Denied physical, sexual, verbal abuse.  Patient [place]  Has [employer] insurance.  Patient's cousin attempted SI.  Aunt is drug abuser.  Patient goes to school and work for [employer]. Fall risk information given and discussed with patient who stated she understood and had no questions, low fall risk. Patient oriented to unit.  Food/drink offered patient. No locker needed for patient.

## 2018-03-05 NOTE — [facility] Group Notes (Signed)
[facility] Group Notes:  (Nursing/MHT/Case Management/Adjunct)  Date:  03/05/2018  Time:  4:00 pm  Type of Therapy:  Psychoeducational Skills  Participation Level:  Did Not Attend  Participation Quality:    Affect:    Cognitive:    Insight:   Engagement in Group:    Modes of Intervention:    Summary of Progress/Problems:  Earline MayotteKnight, Esbeidy Mclaine Shephard 03/05/2018, 6:21 PM

## 2018-03-05 NOTE — BH Assessment (Addendum)
Tele Assessment Note   Patient Name: Debbie Alvarez MRN: [id number] Referring Physician: Sharilyn Sites, PA-C Location of Patient: [facility], [facility] Location of Provider: Behavioral Health TTS Department  Debbie Alvarez is an 22 y.o. single female who presents to [facility] accompanied by her mother and father, who participated in assessment at Pt's request. Pt reports she has felt increasingly depressed and anxious for quite some time but did not reach out to anyone until last week. Mother reports she has noticed Pt "spiraling down" for the past two weeks. Pt says she has no motivation and says she has been canceling on her friends and has been very antisocial lately.  She reports tonight she was having severe anxiety and cut herself numerous times on her arms and legs. Pt sent a text to her family saying she cut herself. Shortly after, her roommates contacted her family and said Pt was "incoherent", behaving strangely and told her parents to come immediately. Pt's mother reports she was shocked when she saw the cuts on Pt's arms and legs. Pt acknowledges recurring suicidal ideation. She is evasive when asked about whether she has plan and intent, replying "yes and no." Pt indicates she doesn't want to give details because she wants to go home. Pt reports one previous suicide attempt by overdose. Pt acknowledges symptoms including crying spells, social withdrawal, loss of interest in usual pleasures, fatigue, irritability, decreased concentration, increased sleep, erratic appetite and feelings of guilt and hopelessness. Pt says she wants to spend all her time in bed. She reports daily panic attacks, particularly when she thinks about work. Pt's mother says Pt is perceived by family and friends as being very self-assured but Pt has made several comments recently to her mother saying she hates herself. She denies current homicidal ideation or history of violence. She denies any history of psychotic  symptoms. Pt reports smoking marijuana daily and occasional alcohol use. Pt's blood alcohol level is 132 and urine drug screen is negative.  Pt has difficulty articulating her stressors. She states there is not anything necessarily that is going poorly or causing her great stress, she is just generally unsatisfied with her life but she is not sure why. Pt's mother reports Pt is a Physicist, medical at [employer], works twenty hours a week at [employer] and is also involved in school activities. Pt lives with roommates. Pt and parents both report Pt has an extensive support system of family and friends and commented several people came to the ED to check on her. Pt denies any history of abuse or trauma. Pt denies any family history of mental health or substance abuse problems. Pt denies any legal issues.  Pt denies any history of inpatient or outpatient mental health treatment. She says she has never been prescribed psychiatric medication. Pt's mother reports last week Pt had an intake telephone screening and has an appointment scheduled with a therpaist next week.   Pt is covered in a blanket, drowsy and oriented x4. Pt speaks in a soft tone, at low volume and normal pace. Motor behavior appears normal. Eye contact is minimal. Pt's mood is depressed and affect is congruent with mood. Thought process is coherent and relevant. There is no indication Pt is currently responding to internal stimuli or experiencing delusional thought content. Pt was polite and cooperative throughout assessment. She says she wants to go home. Pt's parents are concerned for Pt's safety.   Diagnosis: F32.2 Major Depressive Disorder, Single Episode, Severe  F12.20 Cannabis Use Disorder  Past Medical History:  Past Medical History:  Diagnosis Date  . Tonsillar and adenoid hypertrophy 11/2014   snores during sleep, mother denies apnea    Past Surgical History:  Procedure Laterality Date  . NO PAST SURGERIES     . TONSILLECTOMY/ADENOIDECTOMY/TURBINATE REDUCTION N/A 12/01/2014   Procedure: TONSILLECTOMY/ADENOIDECTOMY/TURBINATE REDUCTION;  Surgeon: Drema Halon, MD;  Location: [facility];  Service: ENT;  Laterality: N/A;    Family History:  Family History  Problem Relation Age of Onset  . Hypertension Maternal Grandmother     Social History:  reports that she has never smoked. She has never used smokeless tobacco. She reports that she does not drink alcohol or use drugs.  Additional Social History:  Alcohol / Drug Use Pain Medications: See MAR Prescriptions: See MAR Over the Counter: See MAR History of alcohol / drug use?: Yes Longest period of sobriety (when/how long): Unknown Negative Consequences of Use: (Pt denies) Withdrawal Symptoms: (Pt denies) Substance #1 Name of Substance 1: Alcohol 1 - Age of First Use: 18 1 - Amount (size/oz): Varies 1 - Frequency: Infrequent 1 - Duration: Ongoing 1 - Last Use / Amount: 03/04/18 Substance #2 Name of Substance 2: Marijuana 2 - Age of First Use: Adolescent 2 - Amount (size/oz): Varies 2 - Frequency: Daily 2 - Duration: Ongoing 2 - Last Use / Amount: 03/04/18  CIWA: CIWA-Ar BP: 133/82 Pulse Rate: 96 COWS:    Allergies: No Known Allergies  Home Medications:  (Not in a hospital admission)  OB/GYN Status:  No LMP recorded.  General Assessment Data Location of Assessment: [facility] ED TTS Assessment: In system Is this a Tele or Face-to-Face Assessment?: Tele Assessment Is this an Initial Assessment or a Re-assessment for this encounter?: Initial Assessment Marital status: Single Maiden name: NA Is patient pregnant?: No Pregnancy Status: No Living Arrangements: Non-relatives/Friends(Roommates) Can pt return to current living arrangement?: Yes Admission Status: Voluntary Is patient capable of signing voluntary admission?: Yes Referral Source: Self/Family/Friend Insurance type: Medical sales representative     Crisis Care  Plan Living Arrangements: Non-relatives/Friends(Roommates) Legal Guardian: Other:(Self) Name of Psychiatrist: None Name of Therapist: None  Education Status Is patient currently in school?: Yes Current Grade: Junior in college Highest grade of school patient has completed: Sophomore in college Name of school: Haematologist person: NA IEP information if applicable: NA  Risk to self with the past 6 months Suicidal Ideation: Yes-Currently Present Has patient been a risk to self within the past 6 months prior to admission? : Yes Suicidal Intent: No Has patient had any suicidal intent within the past 6 months prior to admission? : Yes Is patient at risk for suicide?: Yes Suicidal Plan?: Yes-Currently Present Has patient had any suicidal plan within the past 6 months prior to admission? : Yes Specify Current Suicidal Plan: Pt would not specify Access to Means: Yes Specify Access to Suicidal Means: Access to sharps What has been your use of drugs/alcohol within the last 12 months?: Pt uses alcohol and marijuana Previous Attempts/Gestures: Yes How many times?: 1(One attempt by overdose) Other Self Harm Risks: Pt has multiple cuts Triggers for Past Attempts: Unknown Intentional Self Injurious Behavior: Cutting Comment - Self Injurious Behavior: Pt has mulitple cuts on arms and legs Family Suicide History: Yes Recent stressful life event(s): Other (Comment)(School and job stress) Persecutory voices/beliefs?: No Depression: Yes Depression Symptoms: Tearfulness, Isolating, Loss of interest in usual pleasures, Despondent, Fatigue, Guilt, Feeling worthless/self pity, Feeling angry/irritable Substance abuse history and/or treatment  for substance abuse?: Yes Suicide prevention information given to non-admitted patients: Not applicable  Risk to Others within the past 6 months Homicidal Ideation: No Does patient have any lifetime risk of violence toward others beyond the six months prior to  admission? : No Thoughts of Harm to Others: No Current Homicidal Intent: No Current Homicidal Plan: No Access to Homicidal Means: No Identified Victim: None History of harm to others?: No Assessment of Violence: None Noted Violent Behavior Description: Pt denies history of violence Does patient have access to weapons?: No Criminal Charges Pending?: No Does patient have a court date: No Is patient on probation?: No  Psychosis Hallucinations: None noted Delusions: None noted  Mental Status Report Appearance/Hygiene: Other (Comment)(Under blanket) Eye Contact: Poor Motor Activity: Unremarkable Speech: Soft Level of Consciousness: Drowsy Mood: Depressed, Anxious Affect: Depressed Anxiety Level: Panic Attacks Panic attack frequency: daily Most recent panic attack: 03/04/18 Thought Processes: Coherent, Relevant Judgement: Partial Orientation: Person, Place, Time, Situation, Appropriate for developmental age Obsessive Compulsive Thoughts/Behaviors: None  Cognitive Functioning Concentration: Fair Memory: Recent Intact, Remote Intact Is patient IDD: No Is patient DD?: No Insight: Fair Impulse Control: Fair Appetite: Fair Have you had any weight changes? : No Change Sleep: Increased Total Hours of Sleep: 10 Vegetative Symptoms: Staying in bed  ADLScreening [facility] Assessment Services) Patient's cognitive ability adequate to safely complete daily activities?: Yes Patient able to express need for assistance with ADLs?: Yes Independently performs ADLs?: Yes (appropriate for developmental age)  Prior Inpatient Therapy Prior Inpatient Therapy: No  Prior Outpatient Therapy Prior Outpatient Therapy: No Does patient have an ACCT team?: No Does patient have Intensive In-House Services?  : No Does patient have [facility] services? : No Does patient have P4CC services?: No  ADL Screening (condition at time of admission) Patient's cognitive ability adequate to safely complete daily  activities?: Yes Is the patient deaf or have difficulty hearing?: No Does the patient have difficulty seeing, even when wearing glasses/contacts?: No Does the patient have difficulty concentrating, remembering, or making decisions?: No Patient able to express need for assistance with ADLs?: Yes Does the patient have difficulty dressing or bathing?: No Independently performs ADLs?: Yes (appropriate for developmental age) Does the patient have difficulty walking or climbing stairs?: No Weakness of Legs: None Weakness of Arms/Hands: None       Abuse/Neglect Assessment (Assessment to be complete while patient is alone) Abuse/Neglect Assessment Can Be Completed: Yes Physical Abuse: Denies Verbal Abuse: Denies Sexual Abuse: Denies Exploitation of patient/patient's resources: Denies Self-Neglect: Denies     Merchant navy officerAdvance Directives (For Healthcare) Does Patient Have a Medical Advance Directive?: No Would patient like information on creating a medical advance directive?: No - Patient declined          Disposition: Binnie RailJoAnn Glover, [facility], confirmed bed availability. Gave clinical report to Nira ConnJason Berry, NP who said Pt meets criteria for inpatient psychiatric treatment and accepted Pt to the service of Dr. Carmon GinsbergF. Cobos, room 407-1. Notified Sharilyn SitesLisa Sanders, PA-C and Blase Messandace Nuckles, RN of acceptance.  Disposition Initial Assessment Completed for this Encounter: Yes  This service was provided via telemedicine using a 2-way, interactive audio and video technology.  Names of all persons participating in this telemedicine service and their role in this encounter. Name: Kandice MoosCalyssa Windt Role: Patient  Name: Reece Agarebrecia Sneeringer Role: Pt's mother  Name:  Role: Pt's father  Name: Shela CommonsFord Maeley Matton Jr, [place] Role: TTS counselor   Harlin RainFord Ellis Patsy BaltimoreWarrick Jr, [facility], [facility], [facility] Triage Specialist [phone]  Davonna BellingWarrick  Cheri Kearns 03/05/2018 5:32 AM

## 2018-03-05 NOTE — Progress Notes (Signed)
Adult Psychoeducational Group Note  Date:  03/05/2018 Time:  9:45 PM  Group Topic/Focus:  Wrap-Up Group:   The focus of this group is to help patients review their daily goal of treatment and discuss progress on daily workbooks.  Participation Level:  Active  Participation Quality:  Appropriate  Affect:  Appropriate  Cognitive:  Alert  Insight: Appropriate  Engagement in Group:  Engaged  Modes of Intervention:  Discussion  Additional Comments:  Patient stated she was transitioning being that it is her 1st day. Patient stated that she is glad to be here for herself.   Einar Nolasco L Farida Mcreynolds 03/05/2018, 9:45 PM

## 2018-03-05 NOTE — ED Notes (Signed)
TTS in progress 

## 2018-03-05 NOTE — [facility] Group Notes (Signed)
[facility] LCSW Group Therapy Note  Date/Time: 03/05/18, 1315  Type of Therapy and Topic:  Group Therapy:  Overcoming Obstacles  Participation Level:  Did not attend  Description of Group:    In this group patients will be encouraged to explore what they see as obstacles to their own wellness and recovery. They will be guided to discuss their thoughts, feelings, and behaviors related to these obstacles. The group will process together ways to cope with barriers, with attention given to specific choices patients can make. Each patient will be challenged to identify changes they are motivated to make in order to overcome their obstacles. This group will be process-oriented, with patients participating in exploration of their own experiences as well as giving and receiving support and challenge from other group members.  Therapeutic Goals: 1. Patient will identify personal and current obstacles as they relate to admission. 2. Patient will identify barriers that currently interfere with their wellness or overcoming obstacles.  3. Patient will identify feelings, thought process and behaviors related to these barriers. 4. Patient will identify two changes they are willing to make to overcome these obstacles:    Summary of Patient Progress      Therapeutic Modalities:   Cognitive Behavioral Therapy Solution Focused Therapy Motivational Interviewing Relapse Prevention Therapy  Daleen SquibbGreg Kayda Allers, LCSW

## 2018-03-05 NOTE — Plan of Care (Signed)
Nurse discussed anxiety, depression, coping skills with patient. 

## 2018-03-06 DIAGNOSIS — F1290 Cannabis use, unspecified, uncomplicated: Secondary | ICD-10-CM

## 2018-03-06 DIAGNOSIS — G4700 Insomnia, unspecified: Secondary | ICD-10-CM

## 2018-03-06 DIAGNOSIS — F1099 Alcohol use, unspecified with unspecified alcohol-induced disorder: Secondary | ICD-10-CM

## 2018-03-06 MED ORDER — SERTRALINE HCL 50 MG PO TABS
50.0000 mg | ORAL_TABLET | Freq: Every day | ORAL | Status: DC
  Administered 2018-03-07 – 2018-03-08 (×2): 50 mg via ORAL
  Filled 2018-03-06 (×4): qty 1

## 2018-03-06 NOTE — Plan of Care (Signed)
  Problem: Safety: Goal: Ability to remain free from injury will improve Outcome: Progressing   Problem: Activity: Goal: Interest or engagement in activities will improve Outcome: Progressing   Problem: Coping: Goal: Ability to demonstrate self-control will improve Outcome: Progressing   Problem: Coping: Goal: Coping ability will improve Outcome: Progressing   Problem: Medication: Goal: Compliance with prescribed medication regimen will improve Outcome: Progressing   Problem: Coping: Goal: Ability to identify and develop effective coping behavior will improve Outcome: Progressing

## 2018-03-06 NOTE — [facility] Group Notes (Signed)
LCSW Group Therapy Note 03/06/2018 3:10 PM  Type of Therapy/Topic: Group Therapy: Feelings about Diagnosis  Participation Level: Active   Description of Group:  This group will allow patients to explore their thoughts and feelings about diagnoses they have received. Patients will be guided to explore their level of understanding and acceptance of these diagnoses. Facilitator will encourage patients to process their thoughts and feelings about the reactions of others to their diagnosis and will guide patients in identifying ways to discuss their diagnosis with significant others in their lives. This group will be process-oriented, with patients participating in exploration of their own experiences, giving and receiving support, and processing challenge from other group members.  Therapeutic Goals: 1. Patient will demonstrate understanding of diagnosis as evidenced by identifying two or more symptoms of the disorder 2. Patient will be able to express two feelings regarding the diagnosis 3. Patient will demonstrate their ability to communicate their needs through discussion and/or role play  Summary of Patient Progress:   Debbie HellerCalyssa was engaged and participated throughout. She reports that she recently received a mental health diagnosis and that she does not know he she feels. Debbie Alvarez states that her main problem was not accepting the fact that she needed professional help for her symptoms. Debbie Alvarez states that she plans to learn as much as she can while she is here and that she wants to learn how to utilize her resources to help her move forward.    Therapeutic Modalities:  Cognitive Behavioral Therapy Brief Therapy Feelings Identification    Verdie Barrows Catalina AntiguaWilliams LCSWA Clinical Social Worker

## 2018-03-06 NOTE — [facility] Group Notes (Signed)
Pt was invited but did not attend orientation/goals group. 

## 2018-03-06 NOTE — Progress Notes (Signed)
D:  Debbie Alvarez has been in her room much of the morning.  She has not been attending groups.  Minimal interaction with peers but has been interacting with her roommate.  She denies SI/HI or A/V hallucinations.  Self inventory completed and reports depression 8 /10, hopelessness 7/10 and anxiety 9/10.  Goal for today is "want to participate" and she will accomplish this goal by "go to groups."  She denies any pain and appears to be in no physical distress.  She is taking her medications without difficulty. A:  1:1 with RN for support and encouragement.  Medications as ordered.  Q 15 minute checks maintained for safety.  Encouraged participation in group and unit activities. R:  Danyka remains safe on the unit.  We will continue to monitor the progress towards her goals.

## 2018-03-06 NOTE — [facility] Suicide Risk Assessment (Signed)
[facility] INPATIENT:  Family/Significant Other Suicide Prevention Education  Suicide Prevention Education:  Education Completed;  Debbie Alvarez, mother, [phone],has been identified by the patient as the family member/significant other with whom the patient will be residing, and identified as the person(s) who will aid the patient in the event of a mental health crisis (suicidal ideations/suicide attempt).  With written consent from the patient, the family member/significant other has been provided the following suicide prevention education, prior to the and/or following the discharge of the patient.  The suicide prevention education provided includes the following:  Suicide risk factors  Suicide prevention and interventions  National Suicide Hotline telephone number  [facility] assessment telephone number  [facility] Emergency Assistance 911  [facility] and/or Residential Mobile Crisis Unit telephone number  Request made of family/significant other to:  Remove weapons (e.g., guns, rifles, knives), all items previously/currently identified as safety concern.  No guns in the home, per Debrecia.  Remove drugs/medications (over-the-counter, prescriptions, illicit drugs), all items previously/currently identified as a safety concern.  The family member/significant other verbalizes understanding of the suicide prevention education information provided.  The family member/significant other agrees to remove the items of safety concern listed above.  Mother reports pt has been in a downward spiral, getting overwhelmed by "small things"  Pt is usually social and has been isolating herself, sleeping a lot, not returning calls from friends.  Mom is planning for pt to come stay with her after discharge and has already spoken to school about medical withdrawal for this semester.  Debbie Alvarez, Debbie Sandell Jon, LCSW 03/06/2018, 4:20 PM

## 2018-03-06 NOTE — Progress Notes (Signed)
[facility] MD Progress Note  03/06/2018 1:42 PM Debbie Alvarez  MRN:  [id number] Subjective: Patient reports she is feeling better.  Currently denies suicidal ideations.  She states she had a visit from family last night which caused her to feel sad and anxious.  States "they love me, but I do not want them to see me in a place like this". Currently denies medication side effects and is thus far tolerating Zoloft trial well.. Objective: I have discussed case with treatment team and have met with patient.. She is a 22 year old single female, college student, also employed and volunteering.  Presented to the emergency room on March 31 with self-inflicted cuts to arm/thighs and endorsing depression.  Blood alcohol level 132 on admission.  Of note patient denies pattern of alcohol abuse or dependence and states she does not normally drink often.  Currently she is presenting alert, attentive, vaguely depressed and constricted in affect although smiles at times appropriately.  Denies suicidal ideations.  Not currently presenting with any symptoms of alcohol withdrawal.  No tremors, no diaphoresis, no psychomotor agitation, vitals stable. Reports long history of depression and anxiety, reports panic attacks.  States she has not been in psychiatric treatment in the past. At present on Zoloft which thus far she is tolerating well. On unit presents calm, polite, cooperative on approach.  She has endorsed anxiety and depression.  Currently denies suicidal ideations and contracts for safety on unit Principal Problem:  MDD Diagnosis:   Patient Active Problem List   Diagnosis Date Noted  . Severe recurrent major depression without psychotic features ([place]) [F33.2] 03/05/2018  . Premenstrual syndrome [N94.3] 01/28/2015   Total Time spent with patient: 20 minutes  Past Medical History:  Past Medical History:  Diagnosis Date  . Anxiety   . Depression   . Tonsillar and adenoid hypertrophy 11/2014   snores during  sleep, mother denies apnea    Past Surgical History:  Procedure Laterality Date  . NO PAST SURGERIES    . TONSILLECTOMY/ADENOIDECTOMY/TURBINATE REDUCTION N/A 12/01/2014   Procedure: TONSILLECTOMY/ADENOIDECTOMY/TURBINATE REDUCTION;  Surgeon: Rozetta Nunnery, MD;  Location: [facility];  Service: ENT;  Laterality: N/A;   Family History:  Family History  Problem Relation Age of Onset  . Hypertension Maternal Grandmother    Social History:  Social History   Substance and Sexual Activity  Alcohol Use Yes  . Alcohol/week: 0.6 oz  . Types: 1 Cans of beer per week   Comment: one beer every other week     Social History   Substance and Sexual Activity  Drug Use Yes  . Types: Marijuana   Comment: couple times a day recently    Social History   Socioeconomic History  . Marital status: Single    Spouse name: Not on file  . Number of children: Not on file  . Years of education: Not on file  . Highest education level: Not on file  Occupational History    Comment: student  Social Needs  . Financial resource strain: Not on file  . Food insecurity:    Worry: Not on file    Inability: Not on file  . Transportation needs:    Medical: Not on file    Non-medical: Not on file  Tobacco Use  . Smoking status: Never Smoker  . Smokeless tobacco: Never Used  Substance and Sexual Activity  . Alcohol use: Yes    Alcohol/week: 0.6 oz    Types: 1 Cans of beer per week  Comment: one beer every other week  . Drug use: Yes    Types: Marijuana    Comment: couple times a day recently  . Sexual activity: Not Currently    Birth control/protection: Abstinence  Lifestyle  . Physical activity:    Days per week: Not on file    Minutes per session: Not on file  . Stress: Not on file  Relationships  . Social connections:    Talks on phone: Not on file    Gets together: Not on file    Attends religious service: Not on file    Active member of club or organization: Not on  file    Attends meetings of clubs or organizations: Not on file    Relationship status: Not on file  Other Topics Concern  . Not on file  Social History Narrative   Exercise-- try, works out at home   Additional Social History:    Pain Medications: see MAR Prescriptions: see MAR Over the Counter: se MAR History of alcohol / drug use?: Yes Longest period of sobriety (when/how long): unsure Negative Consequences of Use: Financial, Work / School Withdrawal Symptoms: Other (Comment)(anxiety, depression) Name of Substance 1: alcohol 1 - Age of First Use: 37 ys old 1 - Amount (size/oz): one beer every other week 1 - Frequency: infrequently 1 - Duration: ongoing 1 - Last Use / Amount: 03/04/2018 Name of Substance 2: THC 2 - Age of First Use: 22 yrs old 2 - Amount (size/oz): couple times a day 2 - Frequency: daily 2 - Duration: ongoing 2 - Last Use / Amount: 03/04/2018  Sleep: Good  Appetite:  Good  Current Medications: Current Facility-Administered Medications  Medication Dose Route Frequency Provider Last Rate Last Dose  . acetaminophen (TYLENOL) tablet 650 mg  650 mg Oral Q6H PRN Lindon Romp A, NP      . alum & mag hydroxide-simeth (MAALOX/MYLANTA) 200-200-20 MG/5ML suspension 30 mL  30 mL Oral Q4H PRN Lindon Romp A, NP      . hydrOXYzine (ATARAX/VISTARIL) tablet 25 mg  25 mg Oral TID PRN Lindon Romp A, NP   25 mg at 03/06/18 0939  . magnesium hydroxide (MILK OF MAGNESIA) suspension 30 mL  30 mL Oral Daily PRN Lindon Romp A, NP      . neomycin-bacitracin-polymyxin (NEOSPORIN) ointment   Topical TID Sharma Covert, MD      . sertraline (ZOLOFT) tablet 25 mg  25 mg Oral Daily Sharma Covert, MD   25 mg at 03/06/18 6237  . traZODone (DESYREL) tablet 50 mg  50 mg Oral QHS PRN Rozetta Nunnery, NP   50 mg at 03/05/18 2321    Lab Results:  Results for orders placed or performed during the hospital encounter of 03/05/18 (from the past 48 hour(s))  Rapid urine drug screen  (hospital performed)     Status: None   Collection Time: 03/04/18 10:52 PM  Result Value Ref Range   Opiates NONE DETECTED NONE DETECTED   Cocaine NONE DETECTED NONE DETECTED   Benzodiazepines NONE DETECTED NONE DETECTED   Amphetamines NONE DETECTED NONE DETECTED   Tetrahydrocannabinol NONE DETECTED NONE DETECTED   Barbiturates NONE DETECTED NONE DETECTED    Comment: (NOTE) DRUG SCREEN FOR MEDICAL PURPOSES ONLY.  IF CONFIRMATION IS NEEDED FOR ANY PURPOSE, NOTIFY LAB WITHIN 5 DAYS. LOWEST DETECTABLE LIMITS FOR URINE DRUG SCREEN Drug Class  Cutoff (ng/mL) Amphetamine and metabolites    1000 Barbiturate and metabolites    200 Benzodiazepine                 751 Tricyclics and metabolites     300 Opiates and metabolites        300 Cocaine and metabolites        300 THC                            50 Performed at [facility], [place] [address], [place], [place] [address]   Comprehensive metabolic panel     Status: Abnormal   Collection Time: 03/04/18 10:53 PM  Result Value Ref Range   Sodium 138 135 - 145 mmol/L   Potassium 3.6 3.5 - 5.1 mmol/L   Chloride 104 101 - 111 mmol/L   CO2 21 (L) 22 - 32 mmol/L   Glucose, Bld 108 (H) 65 - 99 mg/dL   BUN 13 6 - 20 mg/dL   Creatinine, Ser 0.69 0.44 - 1.00 mg/dL   Calcium 9.4 8.9 - 10.3 mg/dL   Total Protein 7.8 6.5 - 8.1 g/dL   Albumin 4.1 3.5 - 5.0 g/dL   AST 19 15 - 41 U/L   ALT 16 14 - 54 U/L   Alkaline Phosphatase 71 38 - 126 U/L   Total Bilirubin 0.5 0.3 - 1.2 mg/dL   GFR calc non Af Amer >60 >60 mL/min   GFR calc Af Amer >60 >60 mL/min    Comment: (NOTE) The eGFR has been calculated using the CKD EPI equation. This calculation has not been validated in all clinical situations. eGFR's persistently <60 mL/min signify possible Chronic Kidney Disease.    Anion gap 13 5 - 15    Comment: Performed at [facility] [address] [place], [place] [address]  Ethanol     Status: Abnormal   Collection  Time: 03/04/18 10:53 PM  Result Value Ref Range   Alcohol, Ethyl (B) 132 (H) <10 mg/dL    Comment:        LOWEST DETECTABLE LIMIT FOR SERUM ALCOHOL IS 10 mg/dL FOR MEDICAL PURPOSES ONLY Performed at [facility], [place] [address] [place], [place] [address]   Salicylate level     Status: None   Collection Time: 03/04/18 10:53 PM  Result Value Ref Range   Salicylate Lvl <6.3 2.8 - 30.0 mg/dL    Comment: Performed at [facility] [address] [place], [place] [address]  Acetaminophen level     Status: Abnormal   Collection Time: 03/04/18 10:53 PM  Result Value Ref Range   Acetaminophen (Tylenol), Serum <10 (L) 10 - 30 ug/mL    Comment:        THERAPEUTIC CONCENTRATIONS VARY SIGNIFICANTLY. A RANGE OF 10-30 ug/mL MAY BE AN EFFECTIVE CONCENTRATION FOR MANY PATIENTS. HOWEVER, SOME ARE BEST TREATED AT CONCENTRATIONS OUTSIDE THIS RANGE. ACETAMINOPHEN CONCENTRATIONS >150 ug/mL AT 4 HOURS AFTER INGESTION AND >50 ug/mL AT 12 HOURS AFTER INGESTION ARE OFTEN ASSOCIATED WITH TOXIC REACTIONS. Performed at [facility], [place] [address], [place], [place] [address]   cbc     Status: None   Collection Time: 03/04/18 10:53 PM  Result Value Ref Range   WBC 7.4 4.0 - 10.5 K/uL   RBC 4.21 3.87 - 5.11 MIL/uL   Hemoglobin 12.0 12.0 - 15.0 g/dL   HCT 37.0 36.0 - 46.0 %  MCV 87.9 78.0 - 100.0 fL   MCH 28.5 26.0 - 34.0 pg   MCHC 32.4 30.0 - 36.0 g/dL   RDW 14.4 11.5 - 15.5 %   Platelets 323 150 - 400 K/uL    Comment: Performed at [facility] [address] [place], [place] [address]  I-Stat beta hCG blood, ED     Status: None   Collection Time: 03/04/18 11:07 PM  Result Value Ref Range   I-stat hCG, quantitative <5.0 <5 mIU/mL   Comment 3            Comment:   GEST. AGE      CONC.  (mIU/mL)   <=1 WEEK        5 - 50     2 WEEKS       50 - 500     3 WEEKS       100 - 10,000     4 WEEKS     1,000 - 30,000        FEMALE AND NON-PREGNANT FEMALE:     LESS THAN 5  mIU/mL     Blood Alcohol level:  Lab Results  Component Value Date   ETH 132 (H) 50/93/2671    Metabolic Disorder Labs: No results found for: HGBA1C, MPG No results found for: PROLACTIN Lab Results  Component Value Date   CHOL 198 03/21/2016   TRIG 211.0 (H) 03/21/2016   HDL 42.20 03/21/2016   CHOLHDL 5 03/21/2016   VLDL 42.2 (H) 03/21/2016   LDLCALC 132 (H) 03/16/2015    Physical Findings: AIMS: Facial and Oral Movements Muscles of Facial Expression: None, normal Lips and Perioral Area: None, normal Jaw: None, normal Tongue: None, normal,Extremity Movements Upper (arms, wrists, hands, fingers): None, normal Lower (legs, knees, ankles, toes): None, normal, Trunk Movements Neck, shoulders, hips: None, normal, Overall Severity Severity of abnormal movements (highest score from questions above): None, normal Incapacitation due to abnormal movements: None, normal Patient's awareness of abnormal movements (rate only patient's report): No Awareness, Dental Status Current problems with teeth and/or dentures?: No Does patient usually wear dentures?: No  CIWA:  CIWA-Ar Total: 1 COWS:  COWS Total Score: 1  Musculoskeletal: Strength & Muscle Tone: within normal limits Gait & Station: normal Patient leans: N/A  Psychiatric Specialty Exam: Physical Exam  ROS does not endorse headache, no chest pain, no dyspnea, no vomiting  Blood pressure 112/74, pulse 80, temperature 97.9 F (36.6 C), temperature source Oral, resp. rate 16, height 5' 4.4" (1.636 m), weight 102.5 kg (226 lb), last menstrual period 02/26/2018, SpO2 98 %.Body mass index is 38.31 kg/m.  General Appearance: Fairly Groomed  Eye Contact:  Good  Speech:  Normal Rate  Volume:  Normal  Mood:  Depressed but reports she is feeling better than before admission  Affect:  Constricted and But reactive and tends to improve during session  Thought Process:  Linear and Descriptions of Associations: Intact  Orientation:   Other:  Fully alert and attentive  Thought Content:  Denies hallucinations and does not appear internally preoccupied, no delusions are expressed  Suicidal Thoughts:  No currently denies suicidal ideations and contracts for safety on unit  Homicidal Thoughts:  No  Memory:  Recent and remote grossly intact  Judgement:  Fair  Insight:  Fair  Psychomotor Activity:  Normal  Concentration:  Concentration: Good and Attention Span: Good  Recall:  Good  Fund of Knowledge:  Good  Language:  Good  Akathisia:  Negative  Handed:  Right  AIMS (if indicated):     Assets:  Communication Skills Desire for Improvement Resilience  ADL's:  Intact  Cognition:  WNL  Sleep:  Number of Hours: 6   Assessment : 22 year old female, college student and employed, presented to ED intoxicated with alcohol and status post self-inflicted cuts.  Reports history of depression, anxiety, panic attacks, without prior psychiatric management. Has been started on Zoloft which does far she is tolerating well.  States she is feeling better but continues to present with a constricted, vaguely sad affect.  Currently denies suicidal ideations  Treatment Plan Summary: Daily contact with patient to assess and evaluate symptoms and progress in treatment, Medication management, Plan inpatient treatment  and medication management as below Encourage group and milieu group participation to work on coping skills and symptom reduction Treatment team working on disposition planning  Increase Zoloft to 50 mgrs QDAY for depression, anxiety Continue Trazodone 50 mgrs QHS PRN for insomnia  Continue Vistaril 25 mgrs Q 8 hours PRN for anxiety Check TSH  Jenne Campus, MD 03/06/2018, 1:42 PM

## 2018-03-06 NOTE — Progress Notes (Signed)
D: Pt denies SI/HI/AVH. Pt is pleasant and cooperative. Pt stated she felt better , accepted she needed to be here and realizing this process is part of her getting better.   A: Pt was offered support and encouragement. Pt was given scheduled medications. Pt was encourage to attend groups. Q 15 minute checks were done for safety.   R:Pt attends groups and interacts well with peers and staff. Pt is taking medication. Pt has no complaints.Pt receptive to treatment and safety maintained on unit.

## 2018-03-06 NOTE — BHH Counselor (Signed)
Adult Comprehensive Assessment  Patient ID: Debbie Alvarez, female   DOB: 1996-10-04, 22 y.o.   MRN: [id number]  Information Source: Information source: Patient  Current Stressors:  Educational / Learning stressors: [employer] student: "I have a lot on my plate"  Everything is not balanced.   Employment / Job issues: Pt reports she is having panic attacks at work.    Living/Environment/Situation:  Living Arrangements: Non-relatives/Friends(3 roomates-[employer]) Living conditions (as described by patient or guardian): I love my roomates How long has patient lived in current situation?: since fall 2018 What is atmosphere in current home: Comfortable, Supportive  Family History:  Marital status: Single Are you sexually active?: No What is your sexual orientation?: bisexual Has your sexual activity been affected by drugs, alcohol, medication, or emotional stress?: no Does patient have children?: No  Childhood History:  By whom was/is the patient raised?: Both parents Additional childhood history information: Parents divorced when pt was in 7th grade.  Pretty positive childhood. Typically Description of patient's relationship with caregiver when they were a child: mom: great, dad: great Patient's description of current relationship with people who raised him/her: mom: great, dad: great How were you disciplined when you got in trouble as a child/adolescent?: appropriate discipline Does patient have siblings?: Yes Number of Siblings: 2 Description of patient's current relationship with siblings: younger brother, step sister.  Brother: "now it's great", also good with step sister Did patient suffer any verbal/emotional/physical/sexual abuse as a child?: No Did patient suffer from severe childhood neglect?: No Has patient ever been sexually abused/assaulted/raped as an adolescent or adult?: No Was the patient ever a victim of a crime or a disaster?: No Witnessed domestic violence?: No Has patient  been effected by domestic violence as an adult?: No  Education:  Highest grade of school patient has completed: Sophomore in college Currently a student?: Yes Name of school: [employer] How long has the patient attended?: currently in [place] year Learning disability?: No  Employment/Work Situation:   Employment situation: Student(also works part time) Patient's job has been impacted by current illness: Yes Describe how patient's job has been impacted: anxiety attacks at work What is the longest time patient has a held a job?: 3 years part time Where was the patient employed at that time?: [employer], 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 Has patient ever been in the [employer]?: No Are There Guns or Other Weapons in Your Home?: No  Financial Resources:   Financial resources: Income from employment Does patient have a representative payee or guardian?: No  Alcohol/Substance Abuse:   What has been your use of drugs/alcohol within the last 12 months?: alcohol: 2-3x month, 4-5 drinks, marijuana: daily, 2 grams at least, 7 months If attempted suicide, did drugs/alcohol play a role in this?: Yes Alcohol/Substance Abuse Treatment Hx: Denies past history Has alcohol/substance abuse ever caused legal problems?: No  Social Support System:   Conservation officer, nature Support System: Good Describe Community Support System: parents, friends Type of faith/religion: Ephriam Knuckles How does patient's faith help to cope with current illness?: I have kind of strayed from faith  Leisure/Recreation:   Leisure and Hobbies: work, makes Designer, fashion/clothing for painting, spending time with friends  Strengths/Needs:   What things does the patient do well?: talking to people, counseling them, I'm a logical person and I like to have a plan In what areas does patient struggle / problems for patient: learning to be happy with me  Discharge Plan:   Does patient have access to transportation?: Yes Will patient be  returning to same living  situation after discharge?: No Plan for living situation after discharge: will be back with mom or dad Currently receiving community mental health services: Yes (From Whom)(therapist: Dr Norville HaggardVanessa Abernathy, [place].  No psychiatrist) Does patient have financial barriers related to discharge medications?: No  Summary/Recommendations:   Summary and Recommendations (to be completed by the evaluator): Pt is 22 year old female from [place].  Pt is diagnosed with major depressive disorder and was admitted due to depression, suicidal ideation, and self cutting.  Pt reports several stressors including school stress and increaed anxiety symptoms.  Recommendations for pt include crisis stabilization, therapeutic milieu, attend and participate in groups, medication management, and development of comprehensive mental wellness plan.  Lorri FrederickWierda, Jackelynn Hosie Jon. 03/06/2018

## 2018-03-06 NOTE — Progress Notes (Signed)
Patient did not attend wrap up group. 

## 2018-03-06 NOTE — Progress Notes (Signed)
D: Pt passive SI-contracts for safety. Denies HI/ AVH. Pt is pleasant and cooperative. Pt endorsed increased anxiety and depression this evening. Pt endorsed being depressed for a long time, and not getting help . Pt appears to understand the importance of getting help.   A: Pt was offered support and encouragement. Pt was given scheduled medications. Pt was encourage to attend groups. Q 15 minute checks were done for safety.   R:Pt attends groups and interacts well with peers and staff. Pt is taking medication. Pt has no complaints.Pt receptive to treatment and safety maintained on unit.

## 2018-03-07 DIAGNOSIS — R450 Nervousness: Secondary | ICD-10-CM

## 2018-03-07 LAB — TSH: TSH: 3.345 u[IU]/mL (ref 0.350–4.500)

## 2018-03-07 NOTE — Progress Notes (Signed)
DAR NOTE: Patient presents with anxious affect and depressed mood.  Pt has been isolating herself, guarded and not forwarding much. Pt stated she want her mediation be right. Pt stated she sept well last night. Denies pain, auditory and visual hallucinations.  Rates depression at 4, hopelessness at 3, and anxiety at 5.  Maintained on routine safety checks.  Medications given as prescribed.  Support and encouragement offered as needed.  Attended group and participated. Will continue to  Monitor.

## 2018-03-07 NOTE — Progress Notes (Signed)
Recreation Therapy Notes  Date: 4.3.19 Time: 9:30 a.m. Location: [address] Dayroom   Group Topic: Stress Management   Goal Area(s) Addresses:  Goal 1.1: To reduce stress  -Patient will report feeling a reduction in stress level  -Patient will identify the importance of stress management  -Patient will participate during stress management group treatment     Intervention: Stress Management   Activity: Meditation- Patients were in a peaceful environment with soft lighting enhancing patients mood. Patients listened to a body scan meditation to help decrease tension and stress levels    Education: Stress Management, Discharge Planning.    Education Outcome: Acknowledges edcuation/In group clarification offered/Needs additional education   Clinical Observations/Feedback:: Patient did not attend     Sheryle HailDarian Ariyanna Oien, Recreation Therapy Intern   Sheryle HailDarian Albi Rappaport 03/07/2018 8:21 AM

## 2018-03-07 NOTE — [facility] Group Notes (Signed)
[facility]      03/07/2018 11:19 AM  Type of Therapy: Mental [facility] Presentation  Participation Level: Did Not Attend    Summary of Progress/Problems:   Invited, chose not to attend.    Alcario DroughtJolan Wheeler Incorvaia LCSWA Clinical Social Worker

## 2018-03-07 NOTE — Progress Notes (Signed)
  DATA ACTION RESPONSE  Objective- Pt. is visible in the dayroom, seen interacting with peers.  Presents with a depressed   affect and mood. Brightens on approach. C/o of anxiety and insomnia this evening.   Subjective- Denies having any SI/HI/AVH/Pain at this time.Is cooperative and remains safe on the unit.  1:1 interaction in private to establish rapport. Encouragement, education, & support given from staff.  PRN trazodone and vistrail requested and will re-eval accordingly.   Safety maintained with Q 15 checks. Continue with POC.

## 2018-03-07 NOTE — Tx Team (Signed)
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 acetaminophen (TYLENOL) tablet 650 mg  650 mg Oral Q6H PRN Lindon Romp A, NP      . alum & mag hydroxide-simeth (MAALOX/MYLANTA) 200-200-20 MG/5ML suspension 30 mL  30 mL Oral Q4H PRN Lindon Romp A, NP      . hydrOXYzine (ATARAX/VISTARIL) tablet 25 mg  25 mg Oral TID PRN Lindon Romp A, NP   25 mg at 03/06/18 2310  . magnesium hydroxide (MILK OF MAGNESIA) suspension 30 mL  30 mL Oral Daily PRN Lindon Romp A, NP      . neomycin-bacitracin-polymyxin (NEOSPORIN) ointment   Topical TID Sharma Covert, MD      . sertraline (ZOLOFT) tablet 50 mg  50 mg Oral Daily Cobos, Myer Peer, MD   50 mg at 03/07/18 0808  . traZODone (DESYREL) tablet 50 mg  50 mg Oral QHS PRN Lindon Romp A, NP   50 mg at 03/06/18 2310   PTA Medications: No medications prior to admission.    Patient Stressors: Network engineer difficulties Medication change or noncompliance Occupational concerns Substance abuse  Patient Strengths: Ability for insight Active sense of humor Average or above average intelligence Capable of independent living Occupational psychologist fund of knowledge Motivation for treatment/growth Physical Health Supportive family/friends  Treatment Modalities: Medication Management, Group therapy, Case management,  1 to 1 session with clinician, Psychoeducation, Recreational therapy.   Physician Treatment Plan for Primary Diagnosis: Severe recurrent major depression without psychotic features  ([name]) Long Term Goal(s): Improvement in symptoms so as ready for discharge Improvement in symptoms so as ready for discharge   Short Term Goals: Ability to identify changes in lifestyle to reduce recurrence of condition will improve Ability to verbalize feelings will improve Ability to disclose and discuss suicidal ideas Ability to demonstrate self-control will improve Ability to identify and develop effective coping behaviors will improve Ability to maintain clinical measurements within normal limits will improve Compliance with prescribed medications will improve Ability to identify triggers associated with substance abuse/mental health issues will improve Ability to identify changes in lifestyle to reduce recurrence of condition will improve Ability to verbalize feelings will improve Ability to disclose and discuss suicidal ideas Ability to demonstrate self-control will improve Ability to identify and develop effective coping behaviors will improve Ability to maintain clinical measurements within normal limits will improve Compliance with prescribed medications will improve Ability to identify triggers associated with substance abuse/mental health issues will improve  Medication Management: Evaluate patient's response, side effects, and tolerance of medication regimen.  Therapeutic Interventions: 1 to 1 sessions, Unit Group sessions and Medication administration.  Evaluation of Outcomes: Not Met  Physician Treatment Plan for Secondary Diagnosis: Principal Problem:   Severe recurrent major depression without psychotic features ([name])  Long Term Goal(s): Improvement in symptoms so as ready for discharge Improvement in symptoms so as ready for discharge   Short Term Goals: Ability to identify changes in lifestyle to reduce recurrence of condition will improve Ability to verbalize feelings will improve Ability to disclose and discuss suicidal ideas Ability to demonstrate  self-control will improve Ability to identify and develop effective coping behaviors will improve Ability  to maintain clinical measurements within normal limits will improve Compliance with prescribed medications will improve Ability to identify triggers associated with substance abuse/mental health issues will improve Ability to identify changes in lifestyle to reduce recurrence of condition will improve Ability to verbalize feelings will improve Ability to disclose and discuss suicidal ideas Ability to demonstrate self-control will improve Ability to identify and develop effective coping behaviors will improve Ability to maintain clinical measurements within normal limits will improve Compliance with prescribed medications will improve Ability to identify triggers associated with substance abuse/mental health issues will improve     Medication Management: Evaluate patient's response, side effects, and tolerance of medication regimen.  Therapeutic Interventions: 1 to 1 sessions, Unit Group sessions and Medication administration.  Evaluation of Outcomes: Not Met   RN Treatment Plan for Primary Diagnosis: Severe recurrent major depression without psychotic features ([name]) Long Term Goal(s): Knowledge of disease and therapeutic regimen to maintain health will improve  Short Term Goals: Ability to remain free from injury will improve, Ability to verbalize feelings will improve, Ability to disclose and discuss suicidal ideas and Ability to identify and develop effective coping behaviors will improve  Medication Management: RN will administer medications as ordered by provider, will assess and evaluate patient's response and provide education to patient for prescribed medication. RN will report any adverse and/or side effects to prescribing provider.  Therapeutic Interventions: 1 on 1 counseling sessions, Psychoeducation, Medication administration, Evaluate responses to treatment, Monitor vital  signs and CBGs as ordered, Perform/monitor CIWA, COWS, AIMS and Fall Risk screenings as ordered, Perform wound care treatments as ordered.  Evaluation of Outcomes: Progressing   LCSW Treatment Plan for Primary Diagnosis: Severe recurrent major depression without psychotic features ([place]) Long Term Goal(s): Safe transition to appropriate next level of care at discharge, Engage patient in therapeutic group addressing interpersonal concerns.  Short Term Goals: Engage patient in aftercare planning with referrals and resources, Increase social support, Increase emotional regulation and Increase skills for wellness and recovery  Therapeutic Interventions: Assess for all discharge needs, 1 to 1 time with Social worker, Explore available resources and support systems, Assess for adequacy in community support network, Educate family and significant other(s) on suicide prevention, Complete Psychosocial Assessment, Interpersonal group therapy.  Evaluation of Outcomes: Not Met   Progress in Treatment: Attending groups: No. Participating in groups: No. Taking medication as prescribed: Yes. Toleration medication: Yes. Family/Significant other contact made: Yes, individual(s) contacted:  parents Patient understands diagnosis: Yes. Discussing patient identified problems/goals with staff: Yes. Medical problems stabilized or resolved: Yes. Denies suicidal/homicidal ideation: Yes. Issues/concerns per patient self-inventory: No.  New problem(s) identified: No, Describe:  CSW continuing to assess  New Short Term/Long Term Goal(s): Patient's goal "getting set up with therapy."  Discharge Plan or Barriers: Patient to discharge home and follow up with aftercare.  Reason for Continuation of Hospitalization: Anxiety Depression Suicidal ideation  Estimated Length of Stay: Continuing to assess  Attendees: Patient: Debbie Alvarez 03/07/2018 1:59 PM  Physician: Larene Beach 03/07/2018 1:59 PM  Nursing: Estill Bamberg RN  03/07/2018 1:59 PM  RN Care Manager: 03/07/2018 1:59 PM  Social Worker: Stephanie Acre, Social Work Intern 03/07/2018 1:59 PM  Recreational Therapist:  03/07/2018 1:59 PM  Other:  03/07/2018 1:59 PM  Other:  03/07/2018 1:59 PM  Other: 03/07/2018 1:59 PM    Scribe for Treatment Team: Joellen Jersey, [employer] 03/07/2018 1:59 PM

## 2018-03-07 NOTE — Progress Notes (Addendum)
[facility]  03/07/2018 1:54 PM Debbie HanCalyssa D Alvarez  MRN:  [id number]   Subjective:  Patient reports that she feels different today. "I don't know how to explain it. I just have an anxious feeling on the inside, where I usually feel anxious on the outside." She thinks it may be the medication. She reports wanting to stay in bed this morning. She feels more depressed today. She denies any SI/HI/AVH and contracts for safety. She wants to continue the medication for at least one more day to see if there is a change.   Objective: Patient's chart and findings reviewed and discussed with treatement team. Patient presents in her room and is pleasant and cooperative. She is still seen smiling and interacting today. After discussing medications we agree to continue the medication at this time and re-evaluate tomorrow.   Principal Problem: Severe recurrent major depression without psychotic features (H[facility]) Diagnosis:   Patient Active Problem List   Diagnosis Date Noted  . Severe recurrent major depression without psychotic features ([facility]) [F33.2] 03/05/2018  . Premenstrual syndrome [N94.3] 01/28/2015   Total Time spent with patient: 15 minutes  Past Psychiatric History: See H&P  Past Medical History:  Past Medical History:  Diagnosis Date  . Anxiety   . Depression   . Tonsillar and adenoid hypertrophy 11/2014   snores during sleep, mother denies apnea    Past Surgical History:  Procedure Laterality Date  . NO PAST SURGERIES    . TONSILLECTOMY/ADENOIDECTOMY/TURBINATE REDUCTION N/A 12/01/2014   Procedure: TONSILLECTOMY/ADENOIDECTOMY/TURBINATE REDUCTION;  Surgeon: Debbie Halonhristopher E Newman, MD;  Location: [facility];  Service: ENT;  Laterality: N/A;   Family History:  Family History  Problem Relation Age of Onset  . Hypertension Maternal Grandmother    Family Psychiatric  History: See H&P Social History:  Social History   Substance and Sexual Activity  Alcohol Use Yes  .  Alcohol/week: 0.6 oz  . Types: 1 Cans of beer per week   Comment: one beer every other week     Social History   Substance and Sexual Activity  Drug Use Yes  . Types: Marijuana   Comment: couple times a day recently    Social History   Socioeconomic History  . Marital status: Single    Spouse name: Not on file  . Number of children: Not on file  . Years of education: Not on file  . Highest education level: Not on file  Occupational History    Comment: student  Social Needs  . Financial resource strain: Not on file  . Food insecurity:    Worry: Not on file    Inability: Not on file  . Transportation needs:    Medical: Not on file    Non-medical: Not on file  Tobacco Use  . Smoking status: Never Smoker  . Smokeless tobacco: Never Used  Substance and Sexual Activity  . Alcohol use: Yes    Alcohol/week: 0.6 oz    Types: 1 Cans of beer per week    Comment: one beer every other week  . Drug use: Yes    Types: Marijuana    Comment: couple times a day recently  . Sexual activity: Not Currently    Birth control/protection: Abstinence  Lifestyle  . Physical activity:    Days per week: Not on file    Minutes per session: Not on file  . Stress: Not on file  Relationships  . Social connections:    Talks on phone: Not on  file    Gets together: Not on file    Attends religious service: Not on file    Active member of club or organization: Not on file    Attends meetings of clubs or organizations: Not on file    Relationship status: Not on file  Other Topics Concern  . Not on file  Social History Narrative   Exercise-- try, works out at home   Additional Social History:    Pain Medications: see MAR Prescriptions: see MAR Over the Counter: se MAR History of alcohol / drug use?: Yes Longest period of sobriety (when/how long): unsure Negative Consequences of Use: Financial, Work / School Withdrawal Symptoms: Other (Comment)(anxiety, depression) Name of Substance 1:  alcohol 1 - Age of First Use: 40 ys old 1 - Amount (size/oz): one beer every other week 1 - Frequency: infrequently 1 - Duration: ongoing 1 - Last Use / Amount: 03/04/2018 Name of Substance 2: THC 2 - Age of First Use: 22 yrs old 2 - Amount (size/oz): couple times a day 2 - Frequency: daily 2 - Duration: ongoing 2 - Last Use / Amount: 03/04/2018                Sleep: Good  Appetite:  Good  Current Medications: Current Facility-Administered Medications  Medication Dose Route Frequency Provider Last Rate Last Dose  . acetaminophen (TYLENOL) tablet 650 mg  650 mg Oral Q6H PRN Nira Conn A, NP      . alum & mag hydroxide-simeth (MAALOX/MYLANTA) 200-200-20 MG/5ML suspension 30 mL  30 mL Oral Q4H PRN Nira Conn A, NP      . hydrOXYzine (ATARAX/VISTARIL) tablet 25 mg  25 mg Oral TID PRN Nira Conn A, NP   25 mg at 03/06/18 2310  . magnesium hydroxide (MILK OF MAGNESIA) suspension 30 mL  30 mL Oral Daily PRN Nira Conn A, NP      . neomycin-bacitracin-polymyxin (NEOSPORIN) ointment   Topical TID Antonieta Pert, MD      . sertraline (ZOLOFT) tablet 50 mg  50 mg Oral Daily Jerek Meulemans, Rockey Situ, MD   50 mg at 03/07/18 0808  . traZODone (DESYREL) tablet 50 mg  50 mg Oral QHS PRN Jackelyn Poling, NP   50 mg at 03/06/18 2310    Lab Results:  Results for orders placed or performed during the hospital encounter of 03/05/18 (from the past 48 hour(s))  TSH     Status: None   Collection Time: 03/07/18  6:46 AM  Result Value Ref Range   TSH 3.345 0.350 - 4.500 uIU/mL    Comment: Performed by a 3rd Generation assay with a functional sensitivity of <=0.01 uIU/mL. Performed at [facility], [address] [place], [place] [address]     Blood Alcohol level:  Lab Results  Component Value Date   ETH 132 (H) 03/04/2018    Metabolic Disorder Labs: No results found for: HGBA1C, MPG No results found for: PROLACTIN Lab Results  Component Value Date   CHOL 198  03/21/2016   TRIG 211.0 (H) 03/21/2016   HDL 42.20 03/21/2016   CHOLHDL 5 03/21/2016   VLDL 42.2 (H) 03/21/2016   LDLCALC 132 (H) 03/16/2015    Physical Findings: AIMS: Facial and Oral Movements Muscles of Facial Expression: None, normal Lips and Perioral Area: None, normal Jaw: None, normal Tongue: None, normal,Extremity Movements Upper (arms, wrists, hands, fingers): None, normal Lower (legs, knees, ankles, toes): None, normal, Trunk Movements Neck, shoulders, hips: None, normal, Overall  Severity Severity of abnormal movements (highest score from questions above): None, normal Incapacitation due to abnormal movements: None, normal Patient's awareness of abnormal movements (rate only patient's report): No Awareness, Dental Status Current problems with teeth and/or dentures?: No Does patient usually wear dentures?: No  CIWA:  CIWA-Ar Total: 1 COWS:  COWS Total Score: 1  Musculoskeletal: Strength & Muscle Tone: within normal limits Gait & Station: normal Patient leans: N/A  Psychiatric Specialty Exam: Physical Exam  Nursing note and vitals reviewed. Constitutional: She is oriented to person, place, and time. She appears well-developed and well-nourished.  Cardiovascular: Normal rate.  Respiratory: Effort normal.  Musculoskeletal: Normal range of motion.  Neurological: She is alert and oriented to person, place, and time.  Skin: Skin is warm.    Review of Systems  Constitutional: Negative.   HENT: Negative.   Eyes: Negative.   Respiratory: Negative.   Cardiovascular: Negative.   Gastrointestinal: Negative.   Genitourinary: Negative.   Musculoskeletal: Negative.   Skin: Negative.   Neurological: Negative.   Endo/Heme/Allergies: Negative.   Psychiatric/Behavioral: Positive for depression. Negative for hallucinations and suicidal ideas. The patient is nervous/anxious.     Blood pressure 105/65, pulse 74, temperature 97.7 F (36.5 C), temperature source Oral, resp.  rate 18, height 5' 4.4" (1.636 m), weight 102.5 kg (226 lb), last menstrual period 02/26/2018, SpO2 98 %.Body mass index is 38.31 kg/m.  General Appearance: Casual  Eye Contact:  Good  Speech:  Clear and Coherent and Normal Rate  Volume:  Normal  Mood:  Depressed  Affect:  Congruent  Thought Process:  Goal Directed and Descriptions of Associations: Intact  Orientation:  Full (Time, Place, and Person)  Thought Content:  WDL  Suicidal Thoughts:  No  Homicidal Thoughts:  No  Memory:  Immediate;   Good Recent;   Good Remote;   Good  Judgement:  Fair  Insight:  Fair  Psychomotor Activity:  Normal  Concentration:  Concentration: Good and Attention Span: Good  Recall:  Good  Fund of Knowledge:  Good  Language:  Good  Akathisia:  No  Handed:  Right  AIMS (if indicated):     Assets:  Communication Skills Desire for Improvement Financial Resources/Insurance Housing Physical Health Social Support Transportation  ADL's:  Intact  Cognition:  WNL  Sleep:  Number of Hours: 5.75   Problems Addressed: MDD severe  Treatment Plan Summary: Daily contact with patient to assess and evaluate symptoms and progress in treatment, Medication management and Plan is to:  -Continue Zoloft 50 mg PO Daily for mood stability -Continue Trazodone 50 mg QHS PRN for insomnia -Continue Vistaril 25 mg PO TID PRN for anxiety -Encourage group therapy participation  Maryfrances Bunnell, FNP 03/07/2018, 1:54 PM   Agree with NP Progress Note

## 2018-03-07 NOTE — Progress Notes (Signed)
Adult Psychoeducational Group Note  Date:  03/07/2018 Time:  7:27 PM  Group Topic/Focus:  Goals Group:   The focus of this group is to help patients establish daily goals to achieve during treatment and discuss how the patient can incorporate goal setting into their daily lives to aide in recovery.  Participation Level:  Active  Participation Quality:  Appropriate  Affect:  Appropriate  Cognitive:  Alert  Insight: Good  Engagement in Group:  Engaged  Modes of Intervention:  Discussion  Additional Comments:  Pt attended group and participated in discussions.  Adis Sturgill R Rupa Lagan 03/07/2018, 7:27 PM

## 2018-03-07 NOTE — Progress Notes (Signed)
Adult Psychoeducational Group Note  Date:  03/07/2018 Time:  9:21 PM  Group Topic/Focus:  Wrap-Up Group:   The focus of this group is to help patients review their daily goal of treatment and discuss progress on daily workbooks.  Participation Level:  Active  Participation Quality:  Appropriate  Affect:  Appropriate  Cognitive:  Alert and Oriented  Insight: Good  Engagement in Group:  Engaged  Modes of Intervention:  Discussion  Additional Comments:  Pt attended group this evening.  Leo GrosserMegan A Montavis Schubring 03/07/2018, 9:21 PM

## 2018-03-08 MED ORDER — TRAZODONE HCL 50 MG PO TABS: 50.0000 mg | ORAL_TABLET | Freq: Every evening | ORAL | 0 refills | Status: DC | PRN

## 2018-03-08 MED ORDER — HYDROXYZINE HCL 25 MG PO TABS: 25.0000 mg | ORAL_TABLET | Freq: Three times a day (TID) | ORAL | 0 refills | Status: AC | PRN

## 2018-03-08 MED ORDER — SERTRALINE HCL 50 MG PO TABS: 50.0000 mg | ORAL_TABLET | Freq: Every day | ORAL | 0 refills | Status: DC

## 2018-03-08 NOTE — Progress Notes (Signed)
  [facility] :  Will you be returning to the same living situation after discharge:  No.  Will be staying with mother. At discharge, do you have transportation home?: Yes,  mother Do you have the ability to pay for your medications: Yes,  [employer]  Release of information consent forms completed and in the chart;  Patient's signature needed at discharge.  Patient to Follow up at: Follow-up Information    [facility] Go on 03/12/2018.   Why:  Please attend your medication appt on Monday, 03/12/18, at 12:30pm.  Please attend your therapy appt with Graylin ShiverVictoria Lewis on Tuesday, 04/03/18, at 9:00am. Contact information: [address] [place] [address] [phone]        [facility] Go on 03/20/2018.   Why:  Please attend your therapy appt on Tuesday, 03/20/18, at 1:30pm. Contact information: [address] [place], [place] [address] P: [phone] F: [phone]          Next level of care provider has [facility] to [facility] Link:no  Safety Planning and Suicide Prevention discussed: Yes,  with mother  Have you used any form of tobacco in the last 30 days? (Cigarettes, Smokeless Tobacco, Cigars, and/or Pipes): No  Has patient been referred to the Quitline?: N/A patient is not a smoker  Patient has been referred for addiction treatment: Yes  Lorri FrederickWierda, Dejanay Wamboldt Jon, LCSW 03/08/2018, 1:47 PM

## 2018-03-08 NOTE — Discharge Summary (Addendum)
Physician Discharge Summary Note  Patient:  Debbie Alvarez is an 22 y.o., female MRN:  [id number] DOB:  1996-06-29 Patient phone:  [phone] (home)  Patient address:   [address] [place] [address],  Total Time spent with patient: 20 minutes  Date of Admission:  03/05/2018 Date of Discharge: 03/08/18  Reason for Admission:  Worsening depression with SI  Principal Problem: Severe recurrent major depression without psychotic features [facility](HCC) Discharge Diagnoses: Patient Active Problem List   Diagnosis Date Noted  . Severe recurrent major depression without psychotic features (HCC) [F33.2] 03/05/2018  . Premenstrual syndrome [N94.3] 01/28/2015    Past Psychiatric History: Patient denied any formal past psychiatric history.  No admissions, no therapy.  Past Medical History:  Past Medical History:  Diagnosis Date  . Anxiety   . Depression   . Tonsillar and adenoid hypertrophy 11/2014   snores during sleep, mother denies apnea    Past Surgical History:  Procedure Laterality Date  . NO PAST SURGERIES    . TONSILLECTOMY/ADENOIDECTOMY/TURBINATE REDUCTION N/A 12/01/2014   Procedure: TONSILLECTOMY/ADENOIDECTOMY/TURBINATE REDUCTION;  Surgeon: Drema Halonhristopher E Newman, MD;  Location: [facility];  Service: ENT;  Laterality: N/A;   Family History:  Family History  Problem Relation Age of Onset  . Hypertension Maternal Grandmother    Family Psychiatric  History: She has a cousin with similar symptoms  Social History:  Social History   Substance and Sexual Activity  Alcohol Use Yes  . Alcohol/week: 0.6 oz  . Types: 1 Cans of beer per week   Comment: one beer every other week     Social History   Substance and Sexual Activity  Drug Use Yes  . Types: Marijuana   Comment: couple times a day recently    Social History   Socioeconomic History  . Marital status: Single    Spouse name: Not on file  . Number of children: Not on file  . Years of education:  Not on file  . Highest education level: Not on file  Occupational History    Comment: student  Social Needs  . Financial resource strain: Not on file  . Food insecurity:    Worry: Not on file    Inability: Not on file  . Transportation needs:    Medical: Not on file    Non-medical: Not on file  Tobacco Use  . Smoking status: Never Smoker  . Smokeless tobacco: Never Used  Substance and Sexual Activity  . Alcohol use: Yes    Alcohol/week: 0.6 oz    Types: 1 Cans of beer per week    Comment: one beer every other week  . Drug use: Yes    Types: Marijuana    Comment: couple times a day recently  . Sexual activity: Not Currently    Birth control/protection: Abstinence  Lifestyle  . Physical activity:    Days per week: Not on file    Minutes per session: Not on file  . Stress: Not on file  Relationships  . Social connections:    Talks on phone: Not on file    Gets together: Not on file    Attends religious service: Not on file    Active member of club or organization: Not on file    Attends meetings of clubs or organizations: Not on file    Relationship status: Not on file  Other Topics Concern  . Not on file  Social History Narrative   Exercise-- try, works out at home  Hospital Course:   03/05/18 [facility] MD Assessment: Patient is seen and examined.  Patient is a 22 year old female with a self-reported history of depression for several years who was brought to the emergency department for evaluation.  The patient stated that over the last several months her depression is worsened, and she is also developed panic attacks.  This occurred approximately 1-2 months ago.  She stated she had been depressed for many years of her life but it never sought help.  Yesterday she felt as though her symptoms had worsened.  She began to cut herself again.  She did not cut herself since she had been in high school.  She stated she just felt numb.  Her blood alcohol on admission was also 132.  Her  roommates notified her parents, and she also texted her parents about the cutting.  They took her to the emergency room at [facility].  She denied any previous therapy or counseling.  She denied a history of sexual, emotional or physical trauma.  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  She admitted to crying spells, helplessness, hopelessness and worthlessness.  She denied any previous suicide attempts.  She stated 1 of the instigating factors around the ring break was going to visit a cousin and suffering her first panic attack.  She describes panic symptoms including syncope.  She was admitted to the hospital for evaluation and stabilization.  Patient remained on the [facility] unit for 4 days and stabilized with medication and tehrapy. Patient was discharged on Zoloft 50 mg Daily, Vistaril 25 mg TID PRN, and Trazodone 50 mg QHS PRN. Patient has shown improvement with improved mood, affect, sleep, appetite, and interaction. Patient has been seen in the day room interacting with peers and staff appropriately. Patient has been attending group. Patient denies any SI/HI/AVh and contracts for safety. She agrees to follow up at [facility]. She is provided with prescriptions for her medications upon discharge.     Physical Findings: AIMS: Facial and Oral Movements Muscles of Facial Expression: None, normal Lips and Perioral Area: None, normal Jaw: None, normal Tongue: None, normal,Extremity Movements Upper (arms, wrists, hands, fingers): None, normal Lower (legs, knees, ankles, toes): None, normal, Trunk Movements Neck, shoulders, hips: None, normal, Overall Severity Severity of abnormal movements (highest score from questions above): None, normal Incapacitation due to abnormal movements: None, normal Patient's awareness of abnormal movements (rate only patient's report): No Awareness, Dental Status Current problems with  teeth and/or dentures?: No Does patient usually wear dentures?: No  CIWA:  CIWA-Ar Total: 1 COWS:  COWS Total Score: 1  Musculoskeletal: Strength & Muscle Tone: within normal limits Gait & Station: normal Patient leans: N/A  Psychiatric Specialty Exam: Physical Exam  Nursing note and vitals reviewed. Constitutional: She is oriented to person, place, and time. She appears well-developed and well-nourished.  Cardiovascular: Normal rate.  Respiratory: Effort normal.  Musculoskeletal: Normal range of motion.  Neurological: She is alert and oriented to person, place, and time.  Skin: Skin is warm.    Review of Systems  Constitutional: Negative.   HENT: Negative.   Eyes: Negative.   Respiratory: Negative.   Cardiovascular: Negative.   Gastrointestinal: Negative.   Genitourinary: Negative.   Musculoskeletal: Negative.   Skin: Negative.   Neurological: Negative.   Endo/Heme/Allergies: Negative.   Psychiatric/Behavioral: Negative.     Blood pressure 109/67, pulse 78, temperature 98.3 F (36.8 C),  temperature source Oral, resp. rate 18, height 5' 4.4" (1.636 m), weight 102.5 kg (226 lb), last menstrual period 02/26/2018, SpO2 98 %.Body mass index is 38.31 kg/m.  General Appearance: Casual  Eye Contact:  Good  Speech:  Clear and Coherent and Normal Rate  Volume:  Normal  Mood:  Euthymic  Affect:  Congruent  Thought Process:  Goal Directed and Descriptions of Associations: Intact  Orientation:  Full (Time, Place, and Person)  Thought Content:  WDL  Suicidal Thoughts:  No  Homicidal Thoughts:  No  Memory:  Immediate;   Good Recent;   Good Remote;   Good  Judgement:  Fair  Insight:  Good  Psychomotor Activity:  Normal  Concentration:  Concentration: Good and Attention Span: Good  Recall:  Good  Fund of Knowledge:  Good  Language:  Good  Akathisia:  No  Handed:  Right  AIMS (if indicated):     Assets:  Communication Skills Desire for Improvement Financial  Resources/Insurance Housing Physical Health Social Support Transportation  ADL's:  Intact  Cognition:  WNL  Sleep:  Number of Hours: 5.5     Have you used any form of tobacco in the last 30 days? (Cigarettes, Smokeless Tobacco, Cigars, and/or Pipes): No  Has this patient used any form of tobacco in the last 30 days? (Cigarettes, Smokeless Tobacco, Cigars, and/or Pipes) Yes, No  Blood Alcohol level:  Lab Results  Component Value Date   ETH 132 (H) 03/04/2018    Metabolic Disorder Labs:  No results found for: HGBA1C, MPG No results found for: PROLACTIN Lab Results  Component Value Date   CHOL 198 03/21/2016   TRIG 211.0 (H) 03/21/2016   HDL 42.20 03/21/2016   CHOLHDL 5 03/21/2016   VLDL 42.2 (H) 03/21/2016   LDLCALC 132 (H) 03/16/2015    See Psychiatric Specialty Exam and Suicide Risk Assessment completed by Attending Physician prior to discharge.  Discharge destination:  Home  Is patient on multiple antipsychotic therapies at discharge:  No   Has Patient had three or more failed trials of antipsychotic monotherapy by history:  No  Recommended Plan for Multiple Antipsychotic Therapies: NA   Allergies as of 03/08/2018   No Known Allergies     Medication List    TAKE these medications     Indication  hydrOXYzine 25 MG tablet Commonly known as:  ATARAX/VISTARIL Take 1 tablet (25 mg total) by mouth 3 (three) times daily as needed for anxiety.  Indication:  Feeling Anxious   sertraline 50 MG tablet Commonly known as:  ZOLOFT Take 1 tablet (50 mg total) by mouth daily. For mood control Start taking on:  03/09/2018  Indication:  mood stability   traZODone 50 MG tablet Commonly known as:  DESYREL Take 1 tablet (50 mg total) by mouth at bedtime as needed for sleep.  Indication:  Trouble Sleeping      Follow-up Information    [facility] Follow up.   Contact information: [address] [place] [address] [phone]            Follow-up recommendations:  Continue activity as tolerated. Continue diet as recommended by your PCP. Ensure to keep all appointments with outpatient providers.  Comments:  Patient is instructed prior to discharge to: Take all medications as prescribed by his/her mental healthcare provider. Report any adverse effects and or reactions from the medicines to his/her outpatient provider promptly. Patient has been instructed & cautioned: To not engage in alcohol and or  illegal drug use while on prescription medicines. In the event of worsening symptoms, patient is instructed to call the crisis hotline, 911 and or go to the nearest ED for appropriate evaluation and treatment of symptoms. To follow-up with his/her primary care provider for your other medical issues, concerns and or health care needs.    Signed: Gerlene Burdock Money, FNP 03/08/2018, 12:44 PM  Patient seen, Suicide Assessment Completed.  Disposition Plan Reviewed

## 2018-03-08 NOTE — Progress Notes (Signed)
Patient discharged to lobby. Patient was stable and appreciative at that time. All papers and prescriptions were given and valuables returned. Verbal understanding expressed. Denies SI/HI and A/VH. Patient given opportunity to express concerns and ask questions.  

## 2018-03-08 NOTE — [facility] Suicide Risk Assessment (Signed)
[facility]   Principal Problem: Severe recurrent major depression without psychotic features [facility](HCC) Discharge Diagnoses:  Patient Active Problem List   Diagnosis Date Noted  . Severe recurrent major depression without psychotic features (HCC) [F33.2] 03/05/2018  . Premenstrual syndrome [N94.3] 01/28/2015    Total Time spent with patient: 30 minutes  Musculoskeletal: Strength & Muscle Tone: within normal limits Gait & Station: normal Patient leans: N/A  Psychiatric Specialty Exam: ROS denies headache, no chest pain, no shortness of breath, no vomiting   Blood pressure 109/67, pulse 78, temperature 98.3 F (36.8 C), temperature source Oral, resp. rate 18, height 5' 4.4" (1.636 m), weight 102.5 kg (226 lb), last menstrual period 02/26/2018, SpO2 98 %.Body mass index is 38.31 kg/m.  General Appearance: Well Groomed  Eye Contact::  Good  Speech:  Normal Rate409  Volume:  Normal  Mood:  improved, presents euthymic today,describes mood as 8/10  Affect:  more reactive  Thought Process:  Linear and Descriptions of Associations: Intact  Orientation:  Full (Time, Place, and Person)  Thought Content:  denies hallucinations,no delusions, not internally preoccupied   Suicidal Thoughts:  No denies any suicidal or self injurious ideations, denies any homicidal or violent ideations  Homicidal Thoughts:  No  Memory:  recent and remote grossly intact   Judgement:  Other:  improving   Insight:  improving   Psychomotor Activity:  Normal  Concentration:  Good  Recall:  Good  Fund of Knowledge:Good  Language: Good  Akathisia:  Negative  Handed:  Right  AIMS (if indicated):     Assets:  Communication Skills Desire for Improvement Resilience  Sleep:  Number of Hours: 5.5  Cognition: WNL  ADL's:  Intact   Mental Status Per Nursing Assessment::   On Admission:  Suicidal ideation indicated by patient  Demographic Factors:  22 year old single, no children, [employer], lives off campus with roommate  Loss Factors: Busy schedule, work/academic requirements   Historical Factors: No prior psychiatric admissions, denies history of prior suicidal attempts, history of depression  Risk Reduction Factors:   Sense of responsibility to family, Employed, Living with another person, especially a relative, Positive social support and Positive coping skills or problem solving skills  Continued Clinical Symptoms:  Today presents alert, attentive, well related, pleasant, mood improved and currently presents euthymic, affect is brighter , more reactive, no thought disorder , no suicidal or self injurious ideations, no homicidal or violent ideations, future oriented . Denies medication side effects. We reviewed side effect profile to include potential risk of increased suicidal ideations in young adults during initial treatment with antidepressants. Behavior on unit in good control, pleasant on approach.   Cognitive Features That Contribute To Risk:  No gross cognitive deficits noted upon discharge. Is alert , attentive, and oriented x 3    Suicide Risk:  Mild:  Suicidal ideation of limited frequency, intensity, duration, and specificity.  There are no identifiable plans, no associated intent, mild dysphoria and related symptoms, good self-control (both objective and subjective assessment), few other risk factors, and identifiable protective factors, including available and accessible social support.  Follow-up Information    [facility].   Contact information: [address] [place] [address] [phone]           Plan Of Care/Follow-up recommendations:  Activity:  as tolerated  Diet:  regular Tests:  NA Other:  see below  Patient is leaving unit in good spirits . States  her father will be picking her up later today Plans to follow up as above .   Craige Cotta, MD 03/08/2018, 12:25 PM

## 2018-03-20 ENCOUNTER — Ambulatory Visit: Payer: Managed Care, Other (non HMO) | Admitting: Family Medicine

## 2018-03-26 ENCOUNTER — Ambulatory Visit: Payer: Managed Care, Other (non HMO) | Admitting: Family Medicine

## 2018-03-26 ENCOUNTER — Encounter: Payer: Self-pay | Admitting: Family Medicine

## 2018-03-26 ENCOUNTER — Other Ambulatory Visit (HOSPITAL_COMMUNITY)
Admission: RE | Admit: 2018-03-26 | Discharge: 2018-03-26 | Disposition: A | Discharge status: Home / Self Care | Payer: Managed Care, Other (non HMO) | Source: Ambulatory Visit | Attending: Family Medicine | Admitting: Family Medicine

## 2018-03-26 VITALS — BP 112/78 | HR 71 | Temp 98.9°F | Resp 16 | Ht 64.0 in | Wt 229.4 lb

## 2018-03-26 DIAGNOSIS — Z0000 Encounter for general adult medical examination without abnormal findings: Secondary | ICD-10-CM | POA: Diagnosis not present

## 2018-03-26 DIAGNOSIS — Z7251 High risk heterosexual behavior: Secondary | ICD-10-CM | POA: Insufficient documentation

## 2018-03-26 DIAGNOSIS — R82998 Other abnormal findings in urine: Secondary | ICD-10-CM

## 2018-03-26 DIAGNOSIS — Z30011 Encounter for initial prescription of contraceptive pills: Secondary | ICD-10-CM | POA: Diagnosis not present

## 2018-03-26 DIAGNOSIS — N390 Urinary tract infection, site not specified: Secondary | ICD-10-CM | POA: Diagnosis not present

## 2018-03-26 LAB — POC URINALSYSI DIPSTICK (AUTOMATED)
BILIRUBIN UA: NEGATIVE
Blood, UA: NEGATIVE
Glucose, UA: NEGATIVE
KETONES UA: NEGATIVE
Nitrite, UA: NEGATIVE
Protein, UA: NEGATIVE
Spec Grav, UA: >=1.03 — AB (ref 1.010–1.025)
Urobilinogen, UA: 0.2 E.U./dL
pH, UA: 6 (ref 5.0–8.0)

## 2018-03-26 LAB — POCT URINE PREGNANCY: PREG TEST UR: NEGATIVE

## 2018-03-26 MED ORDER — NORGESTIM-ETH ESTRAD TRIPHASIC 0.18/0.215/0.25 MG-35 MCG PO TABS: 1.0000 | ORAL_TABLET | Freq: Every day | ORAL | 11 refills | Status: DC

## 2018-03-26 MED FILL — TRI-SPRINTEC TABLET: 0.18/0.215/ | 28 days supply | Qty: 28 | Fill #0

## 2018-03-26 NOTE — Assessment & Plan Note (Signed)
Pt just needs labs done

## 2018-03-26 NOTE — Patient Instructions (Signed)

## 2018-03-26 NOTE — Assessment & Plan Note (Signed)
Std testing Advised her to use condoms as well

## 2018-03-26 NOTE — Progress Notes (Signed)
Patient ID: Debbie Alvarez, female    DOB: 1996/02/14  Age: 22 y.o. MRN: [id number]    Subjective:  Subjective  HPI [name] presents to discuss birth control--- she may want an IUD but is willing to try the pills first before going to the gyn.  She is planning to be sexually active with her current partner.    Review of Systems  Constitutional: Negative for chills and fever.  HENT: Negative for congestion and hearing loss.   Eyes: Negative for discharge.  Respiratory: Negative for cough and shortness of breath.   Cardiovascular: Negative for chest pain, palpitations and leg swelling.  Gastrointestinal: Negative for abdominal pain, blood in stool, constipation, diarrhea, nausea and vomiting.  Genitourinary: Negative for dysuria, frequency, hematuria and urgency.  Musculoskeletal: Negative for back pain and myalgias.  Skin: Negative for rash.  Allergic/Immunologic: Negative for environmental allergies.  Neurological: Negative for dizziness, weakness and headaches.  Hematological: Does not bruise/bleed easily.  Psychiatric/Behavioral: Negative for suicidal ideas. The patient is not nervous/anxious.     History Past Medical History:  Diagnosis Date  . Anxiety   . Depression   . Tonsillar and adenoid hypertrophy 11/2014   snores during sleep, mother denies apnea    She has a past surgical history that includes No past surgeries and Tonsillectomy/adenoidectomy/turbinate reduction (N/A, 12/01/2014).   Her family history includes Hypertension in her maternal grandmother.She reports that she has never smoked. She has never used smokeless tobacco. She reports that she drinks about 0.6 oz of alcohol per week. She reports that she has current or past drug history. Drug: Marijuana.  Current Outpatient Medications on File Prior to Visit  Medication Sig Dispense Refill  . hydrOXYzine (ATARAX/VISTARIL) 25 MG tablet Take 1 tablet (25 mg total) by mouth 3 (three) times daily as needed  for anxiety. 30 tablet 0  . sertraline (ZOLOFT) 50 MG tablet Take 1 tablet (50 mg total) by mouth daily. For mood control 30 tablet 0   No current facility-administered medications on file prior to visit.      Objective:  Objective  Physical Exam  Constitutional: She is oriented to person, place, and time. She appears well-developed and well-nourished.  HENT:  Head: Normocephalic and atraumatic.  Eyes: Conjunctivae and EOM are normal.  Neck: Normal range of motion. Neck supple. No JVD present. Carotid bruit is not present. No thyromegaly present.  Cardiovascular: Normal rate, regular rhythm and normal heart sounds.  No murmur heard. Pulmonary/Chest: Effort normal and breath sounds normal. No respiratory distress. She has no wheezes. She has no rales. She exhibits no tenderness.  Musculoskeletal: She exhibits no edema.  Neurological: She is alert and oriented to person, place, and time.  Psychiatric: She has a normal mood and affect.  Nursing note and vitals reviewed.  BP 112/78 (BP Location: Right Arm, Cuff Size: Large)   Pulse 71   Temp 98.9 F (37.2 C) (Oral)   Resp 16   Ht 5\' 4"  (1.626 m)   Wt 229 lb 6.4 oz (104.1 kg)   LMP 02/26/2018 (Approximate)   SpO2 96%   BMI 39.38 kg/m  Wt Readings from Last 3 Encounters:  03/26/18 229 lb 6.4 oz (104.1 kg)  03/05/18 226 lb (102.5 kg)  03/04/18 235 lb (106.6 kg)     Lab Results  Component Value Date   WBC 7.4 03/04/2018   HGB 12.0 03/04/2018   HCT 37.0 03/04/2018   PLT 323 03/04/2018   GLUCOSE 108 (H) 03/04/2018  CHOL 198 03/21/2016   TRIG 211.0 (H) 03/21/2016   HDL 42.20 03/21/2016   LDLDIRECT 140.0 03/21/2016   LDLCALC 132 (H) 03/16/2015   ALT 16 03/04/2018   AST 19 03/04/2018   NA 138 03/04/2018   K 3.6 03/04/2018   CL 104 03/04/2018   CREATININE 0.69 03/04/2018   BUN 13 03/04/2018   CO2 21 (L) 03/04/2018   TSH 3.345 03/07/2018    No results found.   Assessment & Plan:  Plan  I have discontinued Safiyyah  D. Kazmierski's traZODone. I am also having her start on Norgestimate-Ethinyl Estradiol Triphasic. Additionally, I am having her maintain her hydrOXYzine and sertraline.  Meds ordered this encounter  Medications  . Norgestimate-Ethinyl Estradiol Triphasic 0.18/0.215/0.25 MG-35 MCG tablet    Sig: Take 1 tablet by mouth daily.    Dispense:  1 Package    Refill:  11    Problem List Items Addressed This Visit      Unprioritized   High risk heterosexual behavior - Primary    Std testing Advised her to use condoms as well       Relevant Orders   Urine cytology ancillary only   HIV antibody   POCT urine pregnancy (Completed)   RPR   HSV 1/2 Ab (IgM), IFA w/rflx Titer   Initiation of oral contraception    rx sent to pharmacy Pt instructed on its use and to use condoms as well If she decides to see gyn for iud or other form of contraception she will let us know       Relevant Medications   Norgestimate-Ethinyl Estradiol Triphasic 0.18/0.215/0.25 MG-35 MCG tablet   Preventative health care    Pt just needs labs done       Relevant Orders   Lipid panel   CBC with Differential/Platelet   TSH   Comprehensive metabolic panel   POCT Urinalysis Dipstick (Automated) (Completed)    Other Visit Diagnoses    Leukocytes in urine       Relevant Orders   Urine Culture      Follow-up: Return if symptoms worsen or fail to improve.  Donato SchultzYvonne R Lowne Chase, DO

## 2018-03-26 NOTE — Assessment & Plan Note (Signed)
rx sent to pharmacy Pt instructed on its use and to use condoms as well If she decides to see gyn for iud or other form of contraception she will let us know

## 2018-03-27 LAB — COMPREHENSIVE METABOLIC PANEL
ALBUMIN: 4.2 g/dL (ref 3.5–5.2)
ALK PHOS: 61 U/L (ref 39–117)
ALT: 18 U/L (ref 0–35)
AST: 18 U/L (ref 0–37)
BILIRUBIN TOTAL: 0.5 mg/dL (ref 0.2–1.2)
BUN: 12 mg/dL (ref 6–23)
CO2: 27 mEq/L (ref 19–32)
CREATININE: 0.69 mg/dL (ref 0.40–1.20)
Calcium: 9.5 mg/dL (ref 8.4–10.5)
Chloride: 103 mEq/L (ref 96–112)
GFR: 137.55 mL/min (ref >60.00)
Glucose, Bld: 75 mg/dL (ref 70–99)
Potassium: 4.1 mEq/L (ref 3.5–5.1)
SODIUM: 137 meq/L (ref 135–145)
TOTAL PROTEIN: 7.7 g/dL (ref 6.0–8.3)

## 2018-03-27 LAB — LIPID PANEL
Cholesterol: 204 mg/dL — ABNORMAL HIGH (ref 0–200)
HDL: 58.7 mg/dL (ref >39.00)
LDL CALC: 131 mg/dL — AB (ref 0–99)
NonHDL: 145.28
TRIGLYCERIDES: 71 mg/dL (ref 0.0–149.0)
Total CHOL/HDL Ratio: 3
VLDL: 14.2 mg/dL (ref 0.0–40.0)

## 2018-03-27 LAB — CBC WITH DIFFERENTIAL/PLATELET
BASOS ABS: 0.1 10*3/uL (ref 0.0–0.1)
BASOS PCT: 1.2 % (ref 0.0–3.0)
EOS ABS: 0.1 10*3/uL (ref 0.0–0.7)
Eosinophils Relative: 1.6 % (ref 0.0–5.0)
HEMATOCRIT: 36.1 % (ref 36.0–46.0)
HEMOGLOBIN: 12.2 g/dL (ref 12.0–15.0)
LYMPHS PCT: 43.6 % (ref 12.0–46.0)
Lymphs Abs: 2.4 10*3/uL (ref 0.7–4.0)
MCHC: 33.8 g/dL (ref 30.0–36.0)
MCV: 87.4 fl (ref 78.0–100.0)
MONOS PCT: 9.8 % (ref 3.0–12.0)
Monocytes Absolute: 0.5 10*3/uL (ref 0.1–1.0)
NEUTROS ABS: 2.4 10*3/uL (ref 1.4–7.7)
Neutrophils Relative %: 43.8 % (ref 43.0–77.0)
Platelets: 258 10*3/uL (ref 150.0–400.0)
RBC: 4.12 Mil/uL (ref 3.87–5.11)
RDW: 14.8 % (ref 11.5–15.5)
WBC: 5.6 10*3/uL (ref 4.0–10.5)

## 2018-03-27 LAB — TSH: TSH: 0.81 u[IU]/mL (ref 0.35–4.50)

## 2018-03-28 LAB — URINE CULTURE
MICRO NUMBER: 90495051
SPECIMEN QUALITY:: ADEQUATE

## 2018-03-28 LAB — HIV ANTIBODY (ROUTINE TESTING W REFLEX): HIV: NONREACTIVE

## 2018-03-28 LAB — URINE CYTOLOGY ANCILLARY ONLY
Chlamydia: NEGATIVE
Neisseria Gonorrhea: NEGATIVE
TRICH (WINDOWPATH): NEGATIVE

## 2018-03-28 LAB — RPR: RPR: NONREACTIVE

## 2018-03-29 ENCOUNTER — Other Ambulatory Visit: Payer: Self-pay | Admitting: Family Medicine

## 2018-03-29 DIAGNOSIS — B9689 Other specified bacterial agents as the cause of diseases classified elsewhere: Secondary | ICD-10-CM

## 2018-03-29 DIAGNOSIS — N760 Acute vaginitis: Principal | ICD-10-CM

## 2018-03-29 LAB — URINE CYTOLOGY ANCILLARY ONLY: CANDIDA VAGINITIS: NEGATIVE

## 2018-03-29 MED ORDER — AMOXICILLIN 875 MG PO TABS: 875.0000 mg | ORAL_TABLET | Freq: Two times a day (BID) | ORAL | 0 refills | Status: DC

## 2018-03-29 MED ORDER — METRONIDAZOLE 500 MG PO TABS: 500.0000 mg | ORAL_TABLET | Freq: Two times a day (BID) | ORAL | 0 refills | Status: DC

## 2018-03-29 NOTE — Addendum Note (Signed)
Addended by: Steve RattlerBLEVINS, Lacrystal Barbe A on: 03/29/2018 01:53 PM   Modules accepted: Orders

## 2018-03-31 LAB — HSV 1/2 AB (IGM), IFA W/RFLX TITER
HSV 1 IGM SCREEN: NEGATIVE
HSV 2 IGM SCREEN: NEGATIVE

## 2018-04-20 ENCOUNTER — Emergency Department (HOSPITAL_COMMUNITY): Payer: Managed Care, Other (non HMO)

## 2018-04-20 ENCOUNTER — Emergency Department (HOSPITAL_COMMUNITY)
Admission: EM | Admit: 2018-04-20 | Discharge: 2018-04-20 | Disposition: A | Discharge status: Home / Self Care | Payer: Managed Care, Other (non HMO) | Attending: Emergency Medicine | Admitting: Emergency Medicine

## 2018-04-20 ENCOUNTER — Other Ambulatory Visit: Payer: Self-pay

## 2018-04-20 ENCOUNTER — Encounter (HOSPITAL_COMMUNITY): Payer: Self-pay

## 2018-04-20 ENCOUNTER — Ambulatory Visit: Payer: Managed Care, Other (non HMO) | Admitting: Family Medicine

## 2018-04-20 DIAGNOSIS — M546 Pain in thoracic spine: Secondary | ICD-10-CM | POA: Insufficient documentation

## 2018-04-20 DIAGNOSIS — G8929 Other chronic pain: Secondary | ICD-10-CM | POA: Diagnosis not present

## 2018-04-20 DIAGNOSIS — M545 Low back pain: Secondary | ICD-10-CM | POA: Insufficient documentation

## 2018-04-20 LAB — PREGNANCY, URINE: Preg Test, Ur: NEGATIVE

## 2018-04-20 MED ORDER — METHOCARBAMOL 500 MG PO TABS: 500.0000 mg | ORAL_TABLET | Freq: Two times a day (BID) | ORAL | 0 refills | Status: DC

## 2018-04-20 MED ORDER — KETOROLAC TROMETHAMINE 30 MG/ML IJ SOLN
30.0000 mg | Freq: Once | INTRAMUSCULAR | Status: AC
  Administered 2018-04-20: 30 mg via INTRAVENOUS
  Filled 2018-04-20: qty 1

## 2018-04-20 MED ORDER — NAPROXEN 500 MG PO TABS: 500.0000 mg | ORAL_TABLET | Freq: Two times a day (BID) | ORAL | 0 refills | Status: DC

## 2018-04-20 MED ORDER — ACETAMINOPHEN 500 MG PO TABS
1000.0000 mg | ORAL_TABLET | Freq: Once | ORAL | Status: DC
  Filled 2018-04-20: qty 2

## 2018-04-20 MED ORDER — METHOCARBAMOL 500 MG PO TABS
1000.0000 mg | ORAL_TABLET | Freq: Once | ORAL | Status: AC
  Administered 2018-04-20: 1000 mg via ORAL
  Filled 2018-04-20: qty 2

## 2018-04-20 NOTE — ED Provider Notes (Signed)
[facility] Note   CSN: [id number] Arrival date & time: 04/20/18  7829     History   Chief Complaint Chief Complaint  Patient presents with  . Back Pain    HPI Debbie Alvarez is a 22 y.o. female.  Debbie Alvarez is a 22 y.o. Female with a history of obesity, anxiety, and depression, presents to the emergency department for evaluation of back pain.  Patient reports she chronically frequently has back pain in the midthoracic back which she associates with her menstrual cycle, but reports for the past month her back pain has been more persistent she reports pain and tightness over her entire upper back, and has now developed some pain in her lower back.  She has tried Tylenol, ibuprofen, Excedrin as well as essential oils and natural remedies without improvement.  Patient reports back pain is fairly constant and intermittently worsens.  She denies any anterior chest pain, she does report this morning pain was so severe that she was experiencing some shortness of breath which has resolved.  She denies any abdominal pain, nausea or vomiting.  No urinary symptoms.  Patient denies any loss of bowel or bladder control, urinary retention or constipation.  Patient denies any numbness, weakness or tingling in her arms or legs.  She has been ambulatory without difficulty.  No associated headaches, vision changes or dizziness.  Patient is on OCPs, but denies any long distance travel or recent surgeries, no cough or hemoptysis, no history of DVT or PE.  Patient was initially going to see her primary care doctor today but because the pain was worse than usual she decided to report to the emergency department.     Past Medical History:  Diagnosis Date  . Anxiety   . Depression   . Tonsillar and adenoid hypertrophy 11/2014   snores during sleep, mother denies apnea    Patient Active Problem List   Diagnosis Date Noted  . High risk heterosexual behavior  03/26/2018  . Initiation of oral contraception 03/26/2018  . Preventative health care 03/26/2018  . Severe recurrent major depression without psychotic features (HCC) 03/05/2018  . Premenstrual syndrome 01/28/2015    Past Surgical History:  Procedure Laterality Date  . NO PAST SURGERIES    . TONSILLECTOMY    . TONSILLECTOMY/ADENOIDECTOMY/TURBINATE REDUCTION N/A 12/01/2014   Procedure: TONSILLECTOMY/ADENOIDECTOMY/TURBINATE REDUCTION;  Surgeon: Drema Halon, MD;  Location: [facility];  Service: ENT;  Laterality: N/A;  . UVULECTOMY       OB History   None      Home Medications    Prior to Admission medications   Medication Sig Start Date End Date Taking? Authorizing Provider  amoxicillin (AMOXIL) 875 MG tablet Take 1 tablet (875 mg total) by mouth 2 (two) times daily. 03/29/18   Donato Schultz, DO  hydrOXYzine (ATARAX/VISTARIL) 25 MG tablet Take 1 tablet (25 mg total) by mouth 3 (three) times daily as needed for anxiety. 03/08/18   Money, Gerlene Burdock, FNP  metroNIDAZOLE (FLAGYL) 500 MG tablet Take 1 tablet (500 mg total) by mouth 2 (two) times daily. 03/29/18   Donato Schultz, DO  Norgestimate-Ethinyl Estradiol Triphasic 0.18/0.215/0.25 MG-35 MCG tablet Take 1 tablet by mouth daily. 03/26/18   Donato Schultz, DO  sertraline (ZOLOFT) 50 MG tablet Take 1 tablet (50 mg total) by mouth daily. For mood control 03/09/18   Money, Gerlene Burdock, FNP    Family History Family History  Problem  Relation Age of Onset  . Hypertension Maternal Grandmother     Social History Social History   Tobacco Use  . Smoking status: Never Smoker  . Smokeless tobacco: Never Used  Substance Use Topics  . Alcohol use: Yes    Alcohol/week: 0.6 oz    Types: 1 Cans of beer per week    Comment: one beer every other week  . Drug use: Yes    Types: Marijuana    Comment: couple times a day recently     Allergies   Patient has no known allergies.   Review of  Systems Review of Systems  Constitutional: Negative for chills and fever.  Eyes: Negative for visual disturbance.  Respiratory: Negative for cough, chest tightness, shortness of breath and wheezing.   Cardiovascular: Negative for chest pain, palpitations and leg swelling.  Gastrointestinal: Negative for abdominal pain, blood in stool, constipation, diarrhea, nausea and vomiting.  Genitourinary: Negative for dysuria, frequency and hematuria.  Musculoskeletal: Positive for back pain and neck pain. Negative for arthralgias, gait problem, joint swelling, myalgias and neck stiffness.  Skin: Negative for color change and rash.  Neurological: Negative for dizziness, weakness, light-headedness, numbness and headaches.     Physical Exam Updated Vital Signs BP (!) 137/98 (BP Location: Left Arm)   Pulse (!) 57   Temp 97.9 F (36.6 C) (Oral)   Resp 16   Ht 5' 3.5" (1.613 m)   Wt 103.9 kg (229 lb)   LMP 04/03/2018 (Approximate)   SpO2 99%   BMI 39.93 kg/m   Physical Exam  Constitutional: She is oriented to person, place, and time. She appears well-developed and well-nourished. No distress.  HENT:  Head: Normocephalic and atraumatic.  Eyes: Right eye exhibits no discharge. Left eye exhibits no discharge.  Neck: Normal range of motion. Neck supple.  Mild tenderness over the cervical spine, primarily paraspinally, range of motion intact in all directions  Cardiovascular: Normal rate, regular rhythm, normal heart sounds and intact distal pulses.  Pulmonary/Chest: Effort normal and breath sounds normal. No stridor. No respiratory distress. She has no wheezes. She has no rales.  Respirations equal and unlabored, patient able to speak in full sentences, lungs clear to auscultation bilaterally  Abdominal: Soft. Bowel sounds are normal. She exhibits no distension and no mass. There is no tenderness. There is no guarding.  Abdomen soft, nondistended, nontender to palpation in all quadrants without  guarding or peritoneal signs  Musculoskeletal:  Tenderness to palpation over the thoracic spine, at midline as well as diffusely over thoracic back musculature with notable tension on palpation, there is very mild midline L-spine tenderness with some tenderness over the lumbar musculature as well.  No overlying erythema, rashes or palpable deformity  Neurological: She is alert and oriented to person, place, and time. Coordination normal.  Speech is clear, able to follow commands CN III-XII intact Normal strength in upper and lower extremities bilaterally including dorsiflexion and plantar flexion, strong and equal grip strength 2+ DTRs in bilateral upper and lower extremities. Sensation normal to light and sharp touch Moves extremities without ataxia, coordination intact  Skin: Skin is warm and dry. Capillary refill takes less than 2 seconds. She is not diaphoretic.  Psychiatric: She has a normal mood and affect. Her behavior is normal.  Nursing note and vitals reviewed.    ED Treatments / Results  Labs (all labs ordered are listed, but only abnormal results are displayed) Labs Reviewed - No data to display  EKG None  Radiology Dg  Cervical Spine Complete  Result Date: 04/20/2018 CLINICAL DATA:  Pt reports that has been having back pain . Pt reports that she has chronic back pain and has worsening in the past month ; no known injury; pt states that the pain only occurred when she was on period till last month, but it was constant last month. EXAM: CERVICAL SPINE - COMPLETE 4+ VIEW COMPARISON:  None. FINDINGS: There is loss of cervical lordosis. No acute fracture or subluxation. Prevertebral soft tissues are normal in appearance. Lung apices are clear. IMPRESSION: Loss of cervical lordosis. Electronically Signed   By: Norva Pavlov M.D.   On: 04/20/2018 16:03   Dg Thoracic Spine 2 View  Result Date: 04/20/2018 CLINICAL DATA:  Pt reports that has been having back pain . Pt reports that  she has chronic back pain and has worsening in the past month ; no known injury; pt states that the pain only occurred when she was on period till last month, but it was constant last month. EXAM: THORACIC SPINE 2 VIEWS COMPARISON:  None. FINDINGS: There is no evidence of thoracic spine fracture. Alignment is normal. No other significant bone abnormalities are identified. IMPRESSION: Negative. Electronically Signed   By: Norva Pavlov M.D.   On: 04/20/2018 16:04   Dg Lumbar Spine Complete  Result Date: 04/20/2018 CLINICAL DATA:  Pt reports that has been having back pain . Pt reports that she has chronic back pain and has worsening in the past month ; no known injury; pt states that the pain only occurred when she was on period till last month, but it was constant last month. EXAM: LUMBAR SPINE - COMPLETE 4+ VIEW COMPARISON:  None. FINDINGS: There is no evidence of lumbar spine fracture. Alignment is normal. Intervertebral disc spaces are maintained. IMPRESSION: Negative. Electronically Signed   By: Norva Pavlov M.D.   On: 04/20/2018 16:05    Procedures Procedures (including critical care time)  Medications Ordered in ED Medications  ketorolac (TORADOL) 30 MG/ML injection 30 mg (30 mg Intravenous Given 04/20/18 1425)  methocarbamol (ROBAXIN) tablet 1,000 mg (1,000 mg Oral Given 04/20/18 1424)     Initial Impression / Assessment and Plan / ED Course  I have reviewed the triage vital signs and the nursing notes.  Pertinent labs & imaging results that were available during my care of the patient were reviewed by me and considered in my medical decision making (see chart for details).  Patient with acute on chronic back pain, she very classically has monthly episodes of back pain with her menstrual cycle but this is now become more constant, is most concentrated in the thoracic back but she also reports some mild cervical and lumbar pain.  No neurological deficits and normal neuro exam.   Patient is ambulatory here in the department..  No loss of bowel or bladder control.  No concern for cauda equina.  No fever, night sweats, weight loss, h/o cancer, IVDU.  No associated chest pain, patient reported a short episode of shortness of breath this morning due to increase in her pain, she is not exhibiting any shortness of breath here.  She is at low risk for PE, she is on an estrogen progesterone birth control, but denies any lower extremity swelling, no recent long distance travel or surgeries, no history of PE or DVT, and patient has not had any tachycardia, hypoxia or tachypnea during her ED stay.  Given the seems to be a chronic issue this been worsening over the past  month I have extremely low suspicion that it is PE or any other acute cardio pulmonary issue.  We will get x-rays of the cervical, thoracic and lumbar spine.  Toradol and Robaxin for pain.    X-ray showed no evidence of acute fracture or abnormality.  Pain improved with Toradol and Robaxin.  Will discharge patient with Robaxin and Naprosyn.  Patient is also working on losing weight, and she reports she is considering discussing breast reduction with her primary care doctor.  She is to follow-up with her primary care doctor.  Strict return precautions discussed.  Patient expresses understanding and is in agreement with plan.    Final Clinical Impressions(s) / ED Diagnoses   Final diagnoses:  Chronic bilateral thoracic back pain  Chronic bilateral low back pain without sciatica    ED Discharge Orders        Ordered    naproxen (NAPROSYN) 500 MG tablet  2 times daily     04/20/18 1629    methocarbamol (ROBAXIN) 500 MG tablet  2 times daily     04/20/18 1629       Dartha Lodge, [place] 04/20/18 1637    Arby Barrette, MD 04/29/18 0020

## 2018-04-20 NOTE — ED Triage Notes (Signed)
Patient c/o upper and mid back pain. Patient states she gets monthly with her periods, but this time the pain has been constant. Patient denies any radiation of pain.

## 2018-04-20 NOTE — Discharge Instructions (Signed)
Your x-rays today show no acute fracture abnormality.  Please take Naprosyn twice daily, as well as Robaxin, Robaxin can cause some drowsiness do not combine with alcohol and do not take before driving.  You can also take Tylenol.  You may use ice and heat, or also try over-the-counter salonpas patches or Biofreeze.  Please follow-up with your primary care doctor as planned.  Return to the emergency department for significantly worsening back pain, especially if you have any numbness, or weakness in extremities, difficulty walking or loss of bowel or bladder control or any other new or concerning symptoms.  Or if you develop chest pain or persistent shortness of breath.

## 2018-04-20 NOTE — ED Notes (Signed)
Pt reports that has been having back pain . Pt reports that she has chronic back pain and has worsening in the past month Pt reports that she took Hydrocodone for pain prior to visit to ED. Pt reports that medication was not effective. Pt reports that she has not ever had a work up on her back pain  In the past.

## 2018-04-24 DIAGNOSIS — M7918 Myalgia, other site: Secondary | ICD-10-CM | POA: Insufficient documentation

## 2018-04-24 DIAGNOSIS — M546 Pain in thoracic spine: Secondary | ICD-10-CM | POA: Insufficient documentation

## 2018-06-18 DIAGNOSIS — N62 Hypertrophy of breast: Secondary | ICD-10-CM | POA: Insufficient documentation

## 2018-07-10 ENCOUNTER — Encounter: Payer: Self-pay | Admitting: Family Medicine

## 2018-07-10 ENCOUNTER — Ambulatory Visit: Payer: Managed Care, Other (non HMO) | Admitting: Family Medicine

## 2018-07-10 DIAGNOSIS — Z30011 Encounter for initial prescription of contraceptive pills: Secondary | ICD-10-CM

## 2018-07-10 DIAGNOSIS — F332 Major depressive disorder, recurrent severe without psychotic features: Secondary | ICD-10-CM | POA: Diagnosis not present

## 2018-07-10 MED ORDER — NORGESTIM-ETH ESTRAD TRIPHASIC 0.18/0.215/0.25 MG-35 MCG PO TABS: 1.0000 | ORAL_TABLET | Freq: Every day | ORAL | 0 refills | Status: DC

## 2018-07-10 NOTE — Patient Instructions (Signed)

## 2018-07-10 NOTE — Progress Notes (Signed)
Patient ID: Debbie Alvarez, female    DOB: 1996/07/29  Age: 22 y.o. MRN: [id number]    Subjective:  Subjective  HPI Debbie HanCalyssa D Varney presents to discuss an emotional support dog.  She got a dog from the shelter -- a pit bull mix and if she has a letter from a dr the apt complex does not charge the pet fee.  The dog has not had any training .     Review of Systems  Constitutional: Negative for appetite change, diaphoresis, fatigue and unexpected weight change.  Eyes: Negative for pain, redness and visual disturbance.  Respiratory: Negative for cough, chest tightness, shortness of breath and wheezing.   Cardiovascular: Negative for chest pain, palpitations and leg swelling.  Endocrine: Negative for cold intolerance, heat intolerance, polydipsia, polyphagia and polyuria.  Genitourinary: Negative for difficulty urinating, dysuria and frequency.  Neurological: Negative for dizziness, light-headedness, numbness and headaches.    History Past Medical History:  Diagnosis Date  . Anxiety   . Depression   . Tonsillar and adenoid hypertrophy 11/2014   snores during sleep, mother denies apnea    She has a past surgical history that includes No past surgeries; Tonsillectomy/adenoidectomy/turbinate reduction (N/A, 12/01/2014); Tonsillectomy; and Uvulectomy.   Her family history includes Hypertension in her maternal grandmother.She reports that she has never smoked. She has never used smokeless tobacco. She reports that she drinks about 1.0 standard drinks of alcohol per week. She reports that she has current or past drug history. Drug: Marijuana.  Current Outpatient Medications on File Prior to Visit  Medication Sig Dispense Refill  . hydrOXYzine (ATARAX/VISTARIL) 25 MG tablet Take 1 tablet (25 mg total) by mouth 3 (three) times daily as needed for anxiety. 30 tablet 0  . sertraline (ZOLOFT) 50 MG tablet Take 1 tablet (50 mg total) by mouth daily. For mood control 30 tablet 0   No current  facility-administered medications on file prior to visit.      Objective:  Objective  Physical Exam  Constitutional: She is oriented to person, place, and time. She appears well-developed and well-nourished.  HENT:  Head: Normocephalic and atraumatic.  Eyes: Conjunctivae and EOM are normal.  Neck: Normal range of motion. Neck supple. No JVD present. Carotid bruit is not present. No thyromegaly present.  Cardiovascular: Normal rate, regular rhythm and normal heart sounds.  No murmur heard. Pulmonary/Chest: Effort normal and breath sounds normal. No respiratory distress. She has no wheezes. She has no rales. She exhibits no tenderness.  Musculoskeletal: She exhibits no edema.  Neurological: She is alert and oriented to person, place, and time.  Psychiatric: She has a normal mood and affect.  Nursing note and vitals reviewed.  BP 114/88 (BP Location: Right Arm, Cuff Size: Large)   Pulse 85   Temp 98.4 F (36.9 C) (Oral)   Resp 16   Ht 5\' 4"  (1.626 m)   Wt 239 lb 12.8 oz (108.8 kg)   LMP 07/08/2018   SpO2 100%   BMI 41.16 kg/m  Wt Readings from Last 3 Encounters:  07/10/18 239 lb 12.8 oz (108.8 kg)  04/20/18 229 lb (103.9 kg)  03/26/18 229 lb 6.4 oz (104.1 kg)     Lab Results  Component Value Date   WBC 5.6 03/26/2018   HGB 12.2 03/26/2018   HCT 36.1 03/26/2018   PLT 258.0 03/26/2018   GLUCOSE 75 03/26/2018   CHOL 204 (H) 03/26/2018   TRIG 71.0 03/26/2018   HDL 58.70 03/26/2018   LDLDIRECT 140.0 03/21/2016  LDLCALC 131 (H) 03/26/2018   ALT 18 03/26/2018   AST 18 03/26/2018   NA 137 03/26/2018   K 4.1 03/26/2018   CL 103 03/26/2018   CREATININE 0.69 03/26/2018   BUN 12 03/26/2018   CO2 27 03/26/2018   TSH 0.81 03/26/2018    Dg Cervical Spine Complete  Result Date: 04/20/2018 CLINICAL DATA:  Pt reports that has been having back pain . Pt reports that she has chronic back pain and has worsening in the past month ; no known injury; pt states that the pain only  occurred when she was on period till last month, but it was constant last month. EXAM: CERVICAL SPINE - COMPLETE 4+ VIEW COMPARISON:  None. FINDINGS: There is loss of cervical lordosis. No acute fracture or subluxation. Prevertebral soft tissues are normal in appearance. Lung apices are clear. IMPRESSION: Loss of cervical lordosis. Electronically Signed   By: Norva Pavlov M.D.   On: 04/20/2018 16:03   Dg Thoracic Spine 2 View  Result Date: 04/20/2018 CLINICAL DATA:  Pt reports that has been having back pain . Pt reports that she has chronic back pain and has worsening in the past month ; no known injury; pt states that the pain only occurred when she was on period till last month, but it was constant last month. EXAM: THORACIC SPINE 2 VIEWS COMPARISON:  None. FINDINGS: There is no evidence of thoracic spine fracture. Alignment is normal. No other significant bone abnormalities are identified. IMPRESSION: Negative. Electronically Signed   By: Norva Pavlov M.D.   On: 04/20/2018 16:04   Dg Lumbar Spine Complete  Result Date: 04/20/2018 CLINICAL DATA:  Pt reports that has been having back pain . Pt reports that she has chronic back pain and has worsening in the past month ; no known injury; pt states that the pain only occurred when she was on period till last month, but it was constant last month. EXAM: LUMBAR SPINE - COMPLETE 4+ VIEW COMPARISON:  None. FINDINGS: There is no evidence of lumbar spine fracture. Alignment is normal. Intervertebral disc spaces are maintained. IMPRESSION: Negative. Electronically Signed   By: Norva Pavlov M.D.   On: 04/20/2018 16:05     Assessment & Plan:  Plan  I have discontinued Debbie Alvarez's amoxicillin, metroNIDAZOLE, naproxen, and methocarbamol. I am also having her maintain her hydrOXYzine, sertraline, and Norgestimate-Ethinyl Estradiol Triphasic.  Meds ordered this encounter  Medications  . Norgestimate-Ethinyl Estradiol Triphasic 0.18/0.215/0.25  MG-35 MCG tablet    Sig: Take 1 tablet by mouth daily.    Dispense:  84 Package    Refill:  0    Problem List Items Addressed This Visit      Unprioritized   Severe recurrent major depression without psychotic features (HCC)    Unable to write a letter due to dog not having any training         Other Visit Diagnoses    Encounter for initial prescription of contraceptive pills       Relevant Medications   Norgestimate-Ethinyl Estradiol Triphasic 0.18/0.215/0.25 MG-35 MCG tablet      Follow-up: Return in about 6 months (around 01/10/2019), or if symptoms worsen or fail to improve, for annual exam, fasting.  Donato Schultz, DO

## 2018-07-12 NOTE — Assessment & Plan Note (Signed)
Unable to write a letter due to dog not having any training

## 2018-11-04 DIAGNOSIS — M542 Cervicalgia: Secondary | ICD-10-CM

## 2018-11-04 DIAGNOSIS — N62 Hypertrophy of breast: Secondary | ICD-10-CM

## 2018-11-04 HISTORY — DX: Hypertrophy of breast: N62

## 2018-11-04 HISTORY — DX: Cervicalgia: M54.2

## 2018-11-16 ENCOUNTER — Ambulatory Visit (HOSPITAL_BASED_OUTPATIENT_CLINIC_OR_DEPARTMENT_OTHER): Admit: 2018-11-16 | Payer: Managed Care, Other (non HMO) | Admitting: Plastic Surgery

## 2018-11-29 ENCOUNTER — Other Ambulatory Visit: Payer: Self-pay

## 2018-11-29 ENCOUNTER — Encounter (HOSPITAL_BASED_OUTPATIENT_CLINIC_OR_DEPARTMENT_OTHER): Payer: Self-pay | Admitting: *Deleted

## 2018-11-29 NOTE — Pre-Procedure Instructions (Signed)
To come pick up Ensure pre-surgery drink 10 oz. - to drink by 0415 DOS; to pick up CHG soap to use in shower night before surgery and AM DOS.

## 2018-12-10 NOTE — Anesthesia Preprocedure Evaluation (Signed)
Anesthesia Evaluation  Patient identified by MRN, date of birth, ID band Patient awake    Reviewed: Allergy & Precautions, H&P , NPO status , Patient's Chart, lab work & pertinent test results  History of Anesthesia Complications Negative for: history of anesthetic complications  Airway Mallampati: I   Neck ROM: Full    Dental  (+) Teeth Intact, Dental Advisory Given   Pulmonary neg pulmonary ROS, Current Smoker,    breath sounds clear to auscultation       Cardiovascular negative cardio ROS   Rhythm:Regular Rate:Normal     Neuro/Psych PSYCHIATRIC DISORDERS Anxiety Depression negative neurological ROS     GI/Hepatic   Endo/Other    Renal/GU      Musculoskeletal   Abdominal   Peds  Hematology   Anesthesia Other Findings   Reproductive/Obstetrics                             Anesthesia Physical  Anesthesia Plan  ASA: I  Anesthesia Plan: General   Post-op Pain Management:    Induction: Intravenous  PONV Risk Score and Plan: 2 and Treatment may vary due to age or medical condition, Ondansetron and Dexamethasone  Airway Management Planned: Oral ETT and LMA  Additional Equipment:   Intra-op Plan:   Post-operative Plan: Extubation in OR  Informed Consent: I have reviewed the patients History and Physical, chart, labs and discussed the procedure including the risks, benefits and alternatives for the proposed anesthesia with the patient or authorized representative who has indicated his/her understanding and acceptance.   Dental advisory given  Plan Discussed with: CRNA, Anesthesiologist and Surgeon  Anesthesia Plan Comments:         Anesthesia Quick Evaluation

## 2018-12-10 NOTE — Progress Notes (Signed)
Patient given Ensure pre-surgery drink to consume at 0415 am DOS.  Patient given surgery soap to use 1/2 the night before surgery and 1/2 the DOS.  Patient verbalized understanding of instructions.

## 2018-12-10 NOTE — H&P (Signed)
Subjective:     Patient ID: Debbie Alvarez is a 23 y.o. female.  HPI  Here for planned bilateral breast reduction. Current- does not know size, has not been properly fitted. The bra in which she initially presented to consult was DD. Complains of several year history neck and back pain. This has been unrelieved by OTC pain medication, NSAIDS, PT, muscle relaxants, hot/cold packs. Denies numbness hands, denies rashes. No prior MMG. Denies FH breast or ovarian ca.   She has ED visit 04/2018 for her back pain which led her to Dr. Shon Baton referral. He felt myofascial in nature, recommend consider reduction.  Student at [employer] and works in [employer]. States she will be resuming her junior year this fall, took medical leave for depression and anxiety last term. Feels this is controlled at this time, has psychiatrist Cyndia Diver MD), Therapist.   Noland Fordyce fluctuated during above issues, stable now.  PastMedicalHistory      Past Medical History:  Diagnosis Date  . Severe recurrent major depression without psychotic features (HCC) 03/05/2018     PastSurgicalHistory  No past surgical history on file.          Current Outpatient Medications on File Prior to Visit  Medication Sig Dispense Refill  . hydrOXYzine HCl (ATARAX) 25 MG tablet Take 25 mg by mouth.    . methocarbamol (ROBAXIN) 500 MG tablet Take 500 mg by mouth.    . naproxen (NAPROSYN) 500 MG tablet Take 500 mg by mouth.    . sertraline (ZOLOFT) 50 MG tablet Take 50 mg by mouth.    . [DISCONTINUED] norgestimate-ethinyl estradiol (ORTHO TRI-CYCLEN,TRI-SPRINTEC) 0.18/0.215/0.25 mg-35 mcg (28) tablet Take by mouth.          Allergies  Allergen Reactions  . Coconut GI Upset (intolerance)     Review of Systems  Constitutional: Negative for fever and unexpected weight change.  Musculoskeletal: Positive for back pain, myalgias and neck pain.  Skin: Negative for color change, rash and wound.  Allergic/Immunologic:  Negative for environmental allergies.   Remainder 12 point review negative    Objective:   Physical Exam   Cardiovascular: Normal rate, regular rhythm and normal heart sounds.  Pulmonary/Chest: Effort normal and breath sounds normal.   Lymphadenopathy:    She has no axillary adenopathy.  Neurological: She is alert and oriented to person, place, and time.  Skin: Skin is warm and dry. No rash noted. No erythema.  Fitzpatrick 6   No masses breasts Chest with piercings Grade 3 ptosis bilateral SN to nipple R 33 L 34 cm BW R 22 L 21 cm Nipple to IMF R 12 L 12   + shoulder grooving  Assessment:     Macromastia Chronic neck and back pain Obesity    Plan:     Reviewed reduction with anchor type scars, possible overnight hospital stay or drains, post operative visits and limitations, recovery. Diminished sensation nipple and breast skin, risk of nipple loss, wound healing problems, asymmetry, incidental carcinoma, changes with wt gain/loss, aging, unacceptable cosmetic appearance reviewed. Discussed effect on breast feeding, changes with future pregnancy. Discussed surgery lasting 3-4 hours, planned outpatient surgery, reviewed drains, ok to shower after 48 hours.   Prescription printed and given to patient: Norco 5-325 mg per tablet Take 1 tablet by mouth every 4 hours as needed for pain for up to 5 days Disp#28 Refills: 0  Anticipate 880 g reduction from each breast.     Glenna Fellows, MD [facility] Plastic & Reconstructive Surgery [phone],  pin [phone]

## 2018-12-11 ENCOUNTER — Ambulatory Visit (HOSPITAL_BASED_OUTPATIENT_CLINIC_OR_DEPARTMENT_OTHER)
Admission: RE | Admit: 2018-12-11 | Discharge: 2018-12-11 | Disposition: A | Discharge status: Home / Self Care | Payer: Managed Care, Other (non HMO) | Attending: Plastic Surgery | Admitting: Plastic Surgery

## 2018-12-11 ENCOUNTER — Encounter (HOSPITAL_BASED_OUTPATIENT_CLINIC_OR_DEPARTMENT_OTHER): Payer: Self-pay

## 2018-12-11 ENCOUNTER — Encounter (HOSPITAL_BASED_OUTPATIENT_CLINIC_OR_DEPARTMENT_OTHER)
Admission: RE | Disposition: A | Discharge status: Home / Self Care | Payer: Self-pay | Source: Home / Self Care | Attending: Plastic Surgery

## 2018-12-11 ENCOUNTER — Other Ambulatory Visit: Payer: Self-pay

## 2018-12-11 ENCOUNTER — Ambulatory Visit (HOSPITAL_BASED_OUTPATIENT_CLINIC_OR_DEPARTMENT_OTHER): Payer: Managed Care, Other (non HMO) | Admitting: Anesthesiology

## 2018-12-11 DIAGNOSIS — Z79899 Other long term (current) drug therapy: Secondary | ICD-10-CM | POA: Diagnosis not present

## 2018-12-11 DIAGNOSIS — F17200 Nicotine dependence, unspecified, uncomplicated: Secondary | ICD-10-CM | POA: Diagnosis not present

## 2018-12-11 DIAGNOSIS — F329 Major depressive disorder, single episode, unspecified: Secondary | ICD-10-CM | POA: Diagnosis not present

## 2018-12-11 DIAGNOSIS — Z791 Long term (current) use of non-steroidal anti-inflammatories (NSAID): Secondary | ICD-10-CM | POA: Diagnosis not present

## 2018-12-11 DIAGNOSIS — M549 Dorsalgia, unspecified: Secondary | ICD-10-CM | POA: Insufficient documentation

## 2018-12-11 DIAGNOSIS — N62 Hypertrophy of breast: Secondary | ICD-10-CM | POA: Diagnosis not present

## 2018-12-11 DIAGNOSIS — M542 Cervicalgia: Secondary | ICD-10-CM | POA: Diagnosis not present

## 2018-12-11 HISTORY — DX: Cervicalgia: M54.2

## 2018-12-11 HISTORY — DX: Other chronic pain: G89.29

## 2018-12-11 HISTORY — PX: BREAST REDUCTION SURGERY: SHX8

## 2018-12-11 HISTORY — DX: Dorsalgia, unspecified: M54.9

## 2018-12-11 HISTORY — DX: Hypertrophy of breast: N62

## 2018-12-11 SURGERY — MAMMOPLASTY, REDUCTION
Anesthesia: General | Site: Breast | Laterality: Bilateral

## 2018-12-11 MED ORDER — GLYCOPYRROLATE PF 0.2 MG/ML IJ SOSY
PREFILLED_SYRINGE | INTRAMUSCULAR | Status: AC
  Filled 2018-12-11: qty 1

## 2018-12-11 MED ORDER — ONDANSETRON HCL 4 MG/2ML IJ SOLN: 4.0000 mg | Freq: Once | INTRAMUSCULAR | Status: DC | PRN

## 2018-12-11 MED ORDER — GABAPENTIN 300 MG PO CAPS
300.0000 mg | ORAL_CAPSULE | ORAL | Status: AC
  Administered 2018-12-11: 300 mg via ORAL

## 2018-12-11 MED ORDER — ROCURONIUM BROMIDE 100 MG/10ML IV SOLN
INTRAVENOUS | Status: DC | PRN
  Administered 2018-12-11: 50 mg via INTRAVENOUS

## 2018-12-11 MED ORDER — FENTANYL CITRATE (PF) 100 MCG/2ML IJ SOLN
INTRAMUSCULAR | Status: AC
  Filled 2018-12-11: qty 2

## 2018-12-11 MED ORDER — GLYCOPYRROLATE 0.2 MG/ML IJ SOLN
INTRAMUSCULAR | Status: DC | PRN
  Administered 2018-12-11: 0.4 mg via INTRAVENOUS

## 2018-12-11 MED ORDER — MIDAZOLAM HCL 2 MG/2ML IJ SOLN
INTRAMUSCULAR | Status: AC
  Filled 2018-12-11: qty 2

## 2018-12-11 MED ORDER — PROPOFOL 500 MG/50ML IV EMUL
INTRAVENOUS | Status: AC
  Filled 2018-12-11: qty 100

## 2018-12-11 MED ORDER — CELECOXIB 200 MG PO CAPS
ORAL_CAPSULE | ORAL | Status: AC
  Filled 2018-12-11: qty 1

## 2018-12-11 MED ORDER — CHLORHEXIDINE GLUCONATE CLOTH 2 % EX PADS: 6.0000 | MEDICATED_PAD | Freq: Once | CUTANEOUS | Status: DC

## 2018-12-11 MED ORDER — MEPERIDINE HCL 25 MG/ML IJ SOLN: 6.2500 mg | INTRAMUSCULAR | Status: DC | PRN

## 2018-12-11 MED ORDER — OXYCODONE HCL 5 MG PO TABS
5.0000 mg | ORAL_TABLET | Freq: Once | ORAL | Status: AC | PRN
  Administered 2018-12-11: 5 mg via ORAL

## 2018-12-11 MED ORDER — DEXAMETHASONE SODIUM PHOSPHATE 10 MG/ML IJ SOLN
INTRAMUSCULAR | Status: AC
  Filled 2018-12-11: qty 1

## 2018-12-11 MED ORDER — ROCURONIUM BROMIDE 50 MG/5ML IV SOSY
PREFILLED_SYRINGE | INTRAVENOUS | Status: AC
  Filled 2018-12-11: qty 5

## 2018-12-11 MED ORDER — LIDOCAINE 2% (20 MG/ML) 5 ML SYRINGE
INTRAMUSCULAR | Status: AC
  Filled 2018-12-11: qty 5

## 2018-12-11 MED ORDER — SCOPOLAMINE 1 MG/3DAYS TD PT72: 1.0000 | MEDICATED_PATCH | Freq: Once | TRANSDERMAL | Status: DC | PRN

## 2018-12-11 MED ORDER — ACETAMINOPHEN 325 MG PO TABS: 325.0000 mg | ORAL_TABLET | ORAL | Status: DC | PRN

## 2018-12-11 MED ORDER — FENTANYL CITRATE (PF) 100 MCG/2ML IJ SOLN: 50.0000 ug | INTRAMUSCULAR | Status: DC | PRN

## 2018-12-11 MED ORDER — ACETAMINOPHEN 160 MG/5ML PO SOLN: 325.0000 mg | ORAL | Status: DC | PRN

## 2018-12-11 MED ORDER — LIDOCAINE HCL (CARDIAC) PF 100 MG/5ML IV SOSY
PREFILLED_SYRINGE | INTRAVENOUS | Status: DC | PRN
  Administered 2018-12-11: 80 mg via INTRAVENOUS

## 2018-12-11 MED ORDER — DEXAMETHASONE SODIUM PHOSPHATE 4 MG/ML IJ SOLN
INTRAMUSCULAR | Status: DC | PRN
  Administered 2018-12-11: 10 mg via INTRAVENOUS

## 2018-12-11 MED ORDER — SODIUM CHLORIDE 0.9 % IV SOLN
INTRAVENOUS | Status: DC | PRN
  Administered 2018-12-11: 40 mL

## 2018-12-11 MED ORDER — CELECOXIB 200 MG PO CAPS
200.0000 mg | ORAL_CAPSULE | ORAL | Status: AC
  Administered 2018-12-11: 200 mg via ORAL

## 2018-12-11 MED ORDER — ONDANSETRON HCL 4 MG/2ML IJ SOLN
INTRAMUSCULAR | Status: DC | PRN
  Administered 2018-12-11: 4 mg via INTRAVENOUS

## 2018-12-11 MED ORDER — CEFAZOLIN SODIUM-DEXTROSE 2-4 GM/100ML-% IV SOLN
INTRAVENOUS | Status: AC
  Filled 2018-12-11: qty 100

## 2018-12-11 MED ORDER — OXYCODONE HCL 5 MG PO TABS
ORAL_TABLET | ORAL | Status: AC
  Filled 2018-12-11: qty 1

## 2018-12-11 MED ORDER — SUFENTANIL CITRATE 50 MCG/ML IV SOLN
INTRAVENOUS | Status: DC | PRN
  Administered 2018-12-11: 5 ug via INTRAVENOUS
  Administered 2018-12-11 (×2): 10 ug via INTRAVENOUS
  Administered 2018-12-11: 5 ug via INTRAVENOUS
  Administered 2018-12-11 (×2): 10 ug via INTRAVENOUS

## 2018-12-11 MED ORDER — PROPOFOL 10 MG/ML IV BOLUS
INTRAVENOUS | Status: DC | PRN
  Administered 2018-12-11: 200 mg via INTRAVENOUS

## 2018-12-11 MED ORDER — SUFENTANIL CITRATE 50 MCG/ML IV SOLN
INTRAVENOUS | Status: AC
  Filled 2018-12-11: qty 1

## 2018-12-11 MED ORDER — LACTATED RINGERS IV SOLN
INTRAVENOUS | Status: DC
  Administered 2018-12-11 (×2): via INTRAVENOUS

## 2018-12-11 MED ORDER — ONDANSETRON HCL 4 MG/2ML IJ SOLN
INTRAMUSCULAR | Status: AC
  Filled 2018-12-11: qty 2

## 2018-12-11 MED ORDER — SUGAMMADEX SODIUM 500 MG/5ML IV SOLN
INTRAVENOUS | Status: AC
  Filled 2018-12-11: qty 5

## 2018-12-11 MED ORDER — SUCCINYLCHOLINE CHLORIDE 200 MG/10ML IV SOSY
PREFILLED_SYRINGE | INTRAVENOUS | Status: AC
  Filled 2018-12-11: qty 10

## 2018-12-11 MED ORDER — ACETAMINOPHEN 500 MG PO TABS
ORAL_TABLET | ORAL | Status: AC
  Filled 2018-12-11: qty 2

## 2018-12-11 MED ORDER — ACETAMINOPHEN 500 MG PO TABS
1000.0000 mg | ORAL_TABLET | ORAL | Status: AC
  Administered 2018-12-11: 1000 mg via ORAL

## 2018-12-11 MED ORDER — NEOSTIGMINE METHYLSULFATE 10 MG/10ML IV SOLN
INTRAVENOUS | Status: DC | PRN
  Administered 2018-12-11: 3 mg via INTRAVENOUS

## 2018-12-11 MED ORDER — MIDAZOLAM HCL 2 MG/2ML IJ SOLN
1.0000 mg | INTRAMUSCULAR | Status: DC | PRN
  Administered 2018-12-11: 2 mg via INTRAVENOUS

## 2018-12-11 MED ORDER — FENTANYL CITRATE (PF) 100 MCG/2ML IJ SOLN
25.0000 ug | INTRAMUSCULAR | Status: DC | PRN
  Administered 2018-12-11: 50 ug via INTRAVENOUS

## 2018-12-11 MED ORDER — EPHEDRINE SULFATE 50 MG/ML IJ SOLN
INTRAMUSCULAR | Status: DC | PRN
  Administered 2018-12-11: 10 mg via INTRAVENOUS

## 2018-12-11 MED ORDER — PHENYLEPHRINE 40 MCG/ML (10ML) SYRINGE FOR IV PUSH (FOR BLOOD PRESSURE SUPPORT)
PREFILLED_SYRINGE | INTRAVENOUS | Status: AC
  Filled 2018-12-11: qty 10

## 2018-12-11 MED ORDER — OXYCODONE HCL 5 MG/5ML PO SOLN: 5.0000 mg | Freq: Once | ORAL | Status: AC | PRN

## 2018-12-11 MED ORDER — HEPARIN SODIUM (PORCINE) 5000 UNIT/ML IJ SOLN: 5000.0000 [IU] | Freq: Once | INTRAMUSCULAR | Status: DC

## 2018-12-11 MED ORDER — BUPIVACAINE LIPOSOME 1.3 % IJ SUSP
INTRAMUSCULAR | Status: AC
  Filled 2018-12-11: qty 20

## 2018-12-11 MED ORDER — EPHEDRINE 5 MG/ML INJ
INTRAVENOUS | Status: AC
  Filled 2018-12-11: qty 10

## 2018-12-11 MED ORDER — GABAPENTIN 300 MG PO CAPS
ORAL_CAPSULE | ORAL | Status: AC
  Filled 2018-12-11: qty 1

## 2018-12-11 MED ORDER — PROPOFOL 500 MG/50ML IV EMUL
INTRAVENOUS | Status: DC | PRN
  Administered 2018-12-11: 15 ug/kg/min via INTRAVENOUS

## 2018-12-11 MED ORDER — NEOSTIGMINE METHYLSULFATE 3 MG/3ML IV SOSY
PREFILLED_SYRINGE | INTRAVENOUS | Status: AC
  Filled 2018-12-11: qty 3

## 2018-12-11 MED ORDER — CEFAZOLIN SODIUM-DEXTROSE 2-4 GM/100ML-% IV SOLN
2.0000 g | INTRAVENOUS | Status: AC
  Administered 2018-12-11: 2 g via INTRAVENOUS

## 2018-12-11 MED FILL — HYDROCODON-APAP 5-325: 5-325 | 5 days supply | Qty: 28 | Fill #0

## 2018-12-11 SURGICAL SUPPLY — 47 items
BINDER BREAST XXLRG (GAUZE/BANDAGES/DRESSINGS) ×2 IMPLANT
BLADE SURG 10 STRL SS (BLADE) ×12 IMPLANT
BNDG GAUZE ELAST 4 BULKY (GAUZE/BANDAGES/DRESSINGS) ×6 IMPLANT
CANISTER SUCT 1200ML W/VALVE (MISCELLANEOUS) ×3 IMPLANT
CHLORAPREP W/TINT 26ML (MISCELLANEOUS) ×5 IMPLANT
COVER BACK TABLE 60X90IN (DRAPES) ×3 IMPLANT
COVER MAYO STAND STRL (DRAPES) ×3 IMPLANT
DERMABOND ADVANCED (GAUZE/BANDAGES/DRESSINGS) ×8
DERMABOND ADVANCED .7 DNX12 (GAUZE/BANDAGES/DRESSINGS) ×1 IMPLANT
DRAIN CHANNEL 15F RND FF W/TCR (WOUND CARE) ×4 IMPLANT
DRAPE TOP ARMCOVERS (MISCELLANEOUS) ×3 IMPLANT
DRAPE U-SHAPE 76X120 STRL (DRAPES) ×3 IMPLANT
DRSG PAD ABDOMINAL 8X10 ST (GAUZE/BANDAGES/DRESSINGS) ×6 IMPLANT
ELECT COATED BLADE 2.86 ST (ELECTRODE) ×3 IMPLANT
ELECT REM PT RETURN 9FT ADLT (ELECTROSURGICAL) ×3
ELECTRODE REM PT RTRN 9FT ADLT (ELECTROSURGICAL) ×1 IMPLANT
EVACUATOR SILICONE 100CC (DRAIN) ×4 IMPLANT
GLOVE BIO SURGEON STRL SZ 6 (GLOVE) ×6 IMPLANT
GLOVE BIO SURGEON STRL SZ7 (GLOVE) ×4 IMPLANT
GLOVE BIOGEL PI IND STRL 7.0 (GLOVE) IMPLANT
GLOVE BIOGEL PI IND STRL 7.5 (GLOVE) IMPLANT
GLOVE BIOGEL PI INDICATOR 7.0 (GLOVE) ×2
GLOVE BIOGEL PI INDICATOR 7.5 (GLOVE) ×4
GOWN STRL REUS W/ TWL LRG LVL3 (GOWN DISPOSABLE) ×2 IMPLANT
GOWN STRL REUS W/TWL LRG LVL3 (GOWN DISPOSABLE) ×4
NDL HYPO 25X1 1.5 SAFETY (NEEDLE) IMPLANT
NEEDLE HYPO 25X1 1.5 SAFETY (NEEDLE) ×3 IMPLANT
NS IRRIG 1000ML POUR BTL (IV SOLUTION) ×3 IMPLANT
PACK BASIN DAY SURGERY FS (CUSTOM PROCEDURE TRAY) ×3 IMPLANT
PENCIL BUTTON HOLSTER BLD 10FT (ELECTRODE) ×3 IMPLANT
PIN SAFETY STERILE (MISCELLANEOUS) ×3 IMPLANT
SHEET MEDIUM DRAPE 40X70 STRL (DRAPES) ×3 IMPLANT
SLEEVE SCD COMPRESS KNEE MED (MISCELLANEOUS) ×3 IMPLANT
SPONGE LAP 18X18 RF (DISPOSABLE) ×10 IMPLANT
STAPLER VISISTAT 35W (STAPLE) ×6 IMPLANT
SUT ETHILON 2 0 FS 18 (SUTURE) ×6 IMPLANT
SUT MNCRL AB 4-0 PS2 18 (SUTURE) ×12 IMPLANT
SUT VIC AB 3-0 PS1 18 (SUTURE) ×14
SUT VIC AB 3-0 PS1 18XBRD (SUTURE) ×4 IMPLANT
SUT VICRYL 4-0 PS2 18IN ABS (SUTURE) ×6 IMPLANT
SYR BULB IRRIGATION 50ML (SYRINGE) ×3 IMPLANT
SYR CONTROL 10ML LL (SYRINGE) ×2 IMPLANT
TOWEL GREEN STERILE FF (TOWEL DISPOSABLE) ×6 IMPLANT
TUBE CONNECTING 20'X1/4 (TUBING) ×1
TUBE CONNECTING 20X1/4 (TUBING) ×2 IMPLANT
UNDERPAD 30X30 (UNDERPADS AND DIAPERS) ×6 IMPLANT
YANKAUER SUCT BULB TIP NO VENT (SUCTIONS) ×3 IMPLANT

## 2018-12-11 NOTE — Anesthesia Postprocedure Evaluation (Signed)
Anesthesia Post Note  Patient: Debbie Alvarez  Procedure(s) Performed: BILATERAL BREAST REDUCTION (Bilateral Breast)     Patient location during evaluation: PACU Anesthesia Type: General Level of consciousness: awake and alert Pain management: pain level controlled Vital Signs Assessment: post-procedure vital signs reviewed and stable Respiratory status: spontaneous breathing, nonlabored ventilation, respiratory function stable and patient connected to nasal cannula oxygen Cardiovascular status: blood pressure returned to baseline and stable Postop Assessment: no apparent nausea or vomiting Anesthetic complications: no    Last Vitals:  Vitals:   12/11/18 1145 12/11/18 1221  BP: 122/77 127/86  Pulse: 92 84  Resp: (!) 23 20  Temp:  36.9 C  SpO2: 98% 98%    Last Pain:  Vitals:   12/11/18 1245  TempSrc:   PainSc: 2                  Naylene Foell

## 2018-12-11 NOTE — Op Note (Signed)
Operative Note   DATE OF OPERATION: 1.7.20  LOCATION: [facility]-outpatient  SURGICAL DIVISION: Plastic Surgery  PREOPERATIVE DIAGNOSES:  1. Macromastia 2. Chronic neck and back pain  POSTOPERATIVE DIAGNOSES:  same  PROCEDURE:  Bilateral breast redution  SURGEON: Glenna Fellows MD MBA  ASSISTANT: none  ANESTHESIA:  General.   EBL: 30 ml  COMPLICATIONS: None immediate.   INDICATIONS FOR PROCEDURE:  The patient, Debbie Alvarez, is a 23 y.o. female born on [birth year], is here for treatment chronic neck and back pain in setting macromastia that has failed conservative measures.   FINDINGS: Right reduction 689 g L 546 g  DESCRIPTION OF PROCEDURE:  The patient was marked standing in the preoperative area to mark sternal notch, chest midline, anterior axillary lines, inframammary folds. The location of new nipple areolar complex was marked at level of on inframammary fold on anterior surface breast by palpation. This was marked symmetric over bilateral breasts. With aid of Wise pattern marker, location of new nipple areolar complex and vertical limbs (8cm) were markedby displacement of breasts along meridian.The patient was taken to the operating room. SCDs were placedand IV antibiotics were given. The patient's operative site was prepped and draped in a sterile fashion. A time out was performed and all information was confirmed to be correct.   Over left breast, superomedial pedicle marked and nipple areolar complex marked with33mm diameter marker. Pedicle deepithlialized and developedto chest wall. Breast tissue resected over lower pole. Medial and lateral flaps developed.Additional superior pole and lateral chest wall tissue excised.Breast tailor tacked closed.  I then directed attention to right breast where superomedial pedicle designed. The pedicle was deepithelialized. Pedicle developed until tension free rotation was possible.Breast tissue resected over  lower pole. Medial and lateral flaps developed.Additional superior pole and lateral chest wall tissue excised. Breast tailor tacked closed, and patient brought to upright sitting position and assessed for symmetry. Patent returned to supine position and breast cavities irrigated and hemostasis obtained. Exparel infiltrated throughout each breast. 15 Fr JP placed in each breast and secured with 2-0 nylon. Closure completed bilateralwith 3-0 vicryl to approximate dermis along inframammary fold and vertical limb. NAC inset with 4-0 vicryl in dermis. Skin closure completed with 4-0 monocryl subcuticular throughout.Tissue adhesive applied. Dry dressing and breast binder applied.  The patient was allowed to wake from anesthesia, extubated and taken to the recovery room in satisfactory condition.   SPECIMENS: right and left breast reduction  DRAINS: 15 Fr JP in right and left breast  Glenna Fellows, MD [facility] Plastic & Reconstructive Surgery [phone], pin [phone]

## 2018-12-11 NOTE — Discharge Instructions (Signed)
**No Tylenol or Ibuprofen/Motrin until after 1:15pm  Information for Discharge Teaching: EXPAREL (bupivacaine liposome injectable suspension)   Your surgeon or anesthesiologist gave you EXPAREL(bupivacaine) to help control your pain after surgery.   EXPAREL is a local anesthetic that provides pain relief by numbing the tissue around the surgical site.  EXPAREL is designed to release pain medication over time and can control pain for up to 72 hours.  Depending on how you respond to EXPAREL, you may require less pain medication during your recovery.  Possible side effects:  Temporary loss of sensation or ability to move in the area where bupivacaine was injected.  Nausea, vomiting, constipation  Rarely, numbness and tingling in your mouth or lips, lightheadedness, or anxiety may occur.  Call your doctor right away if you think you may be experiencing any of these sensations, or if you have other questions regarding possible side effects.  Follow all other discharge instructions given to you by your surgeon or nurse. Eat a healthy diet and drink plenty of water or other fluids.  If you return to the hospital for any reason within 96 hours following the administration of EXPAREL, it is important for health care providers to know that you have received this anesthetic. A teal colored band has been placed on your arm with the date, time and amount of EXPAREL you have received in order to alert and inform your health care providers. Please leave this armband in place for the full 96 hours following administration, and then you may remove the band.   Post Anesthesia Home Care Instructions  Activity: Get plenty of rest for the remainder of the day. A responsible individual must stay with you for 24 hours following the procedure.  For the next 24 hours, DO NOT: -Drive a car -Advertising copywriter -Drink alcoholic beverages -Take any medication unless instructed by your physician -Make any  legal decisions or sign important papers.  Meals: Start with liquid foods such as gelatin or soup. Progress to regular foods as tolerated. Avoid greasy, spicy, heavy foods. If nausea and/or vomiting occur, drink only clear liquids until the nausea and/or vomiting subsides. Call your physician if vomiting continues.  Special Instructions/Symptoms: Your throat may feel dry or sore from the anesthesia or the breathing tube placed in your throat during surgery. If this causes discomfort, gargle with warm salt water. The discomfort should disappear within 24 hours.  If you had a scopolamine patch placed behind your ear for the management of post- operative nausea and/or vomiting:  1. The medication in the patch is effective for 72 hours, after which it should be removed.  Wrap patch in a tissue and discard in the trash. Wash hands thoroughly with soap and water. 2. You may remove the patch earlier than 72 hours if you experience unpleasant side effects which may include dry mouth, dizziness or visual disturbances. 3. Avoid touching the patch. Wash your hands with soap and water after contact with the patch.      About my Jackson-Pratt Bulb Drain  What is a Jackson-Pratt bulb? A Jackson-Pratt is a soft, round device used to collect drainage. It is connected to a long, thin drainage catheter, which is held in place by one or two small stiches near your surgical incision site. When the bulb is squeezed, it forms a vacuum, forcing the drainage to empty into the bulb.  Emptying the Jackson-Pratt bulb- To empty the bulb: 1. Release the plug on the top of the bulb. 2. Pour the  bulb's contents into a measuring container which your nurse will provide. 3. Record the time emptied and amount of drainage. Empty the drain(s) as often as your     doctor or nurse recommends.  Date                  Time                    Amount (Drain 1)                 Amount (Drain  2)  _____________________________________________________________________  _____________________________________________________________________  _____________________________________________________________________  _____________________________________________________________________  _____________________________________________________________________  _____________________________________________________________________  _____________________________________________________________________  _____________________________________________________________________  Squeezing the Jackson-Pratt Bulb- To squeeze the bulb: 1. Make sure the plug at the top of the bulb is open. 2. Squeeze the bulb tightly in your fist. You will hear air squeezing from the bulb. 3. Replace the plug while the bulb is squeezed. 4. Use a safety pin to attach the bulb to your clothing. This will keep the catheter from     pulling at the bulb insertion site.  When to call your doctor- Call your doctor if:  Drain site becomes red, swollen or hot.  You have a fever greater than 101 degrees F.  There is oozing at the drain site.  Drain falls out (apply a guaze bandage over the drain hole and secure it with tape).  Drainage increases daily not related to activity patterns. (You will usually have more drainage when you are active than when you are resting.)  Drainage has a bad odor.         JP Drain [employer] this sheet to all of your post-operative appointments while you have your drains.  Please measure your drains by CC's or ML's.  Make sure you drain and measure your JP Drains 2 or 3 times per day.  At the end of each day, add up totals for the left side and add up totals for the right side.    ( 9 am )     ( 3 pm )        ( 9 pm )                Date L  R  L  R  L  R  Total L/R

## 2018-12-11 NOTE — Anesthesia Procedure Notes (Signed)
Procedure Name: Intubation Date/Time: 12/11/2018 7:35 AM Performed by: Willa Frater, CRNA Pre-anesthesia Checklist: Patient identified, Emergency Drugs available, Suction available and Patient being monitored Patient Re-evaluated:Patient Re-evaluated prior to induction Oxygen Delivery Method: Circle system utilized Preoxygenation: Pre-oxygenation with 100% oxygen Induction Type: IV induction Ventilation: Mask ventilation without difficulty Laryngoscope Size: Mac and 3 Grade View: Grade I Tube type: Oral Tube size: 7.0 mm Number of attempts: 1 Airway Equipment and Method: Stylet and Oral airway Placement Confirmation: ETT inserted through vocal cords under direct vision,  positive ETCO2 and breath sounds checked- equal and bilateral Secured at: 23 cm Tube secured with: Tape Dental Injury: Teeth and Oropharynx as per pre-operative assessment

## 2018-12-11 NOTE — Anesthesia Procedure Notes (Signed)
Performed by: Kathyrn Warmuth D, CRNA       

## 2018-12-11 NOTE — Transfer of Care (Signed)
Immediate Anesthesia Transfer of Care Note  Patient: Debbie Alvarez  Procedure(s) Performed: BILATERAL BREAST REDUCTION (Bilateral Breast)  Patient Location: PACU  Anesthesia Type:General  Level of Consciousness: awake, alert , oriented and drowsy  Airway & Oxygen Therapy: Patient Spontanous Breathing and Patient connected to face mask oxygen  Post-op Assessment: Report given to RN and Post -op Vital signs reviewed and stable  Post vital signs: Reviewed and stable  Last Vitals:  Vitals Value Taken Time  BP 126/68 12/11/2018 11:00 AM  Temp    Pulse 111 12/11/2018 11:02 AM  Resp 23 12/11/2018 11:02 AM  SpO2 98 % 12/11/2018 11:02 AM  Vitals shown include unvalidated device data.  Last Pain:  Vitals:   12/11/18 0653  TempSrc: Oral  PainSc:          Complications: No apparent anesthesia complications

## 2018-12-12 ENCOUNTER — Encounter (HOSPITAL_BASED_OUTPATIENT_CLINIC_OR_DEPARTMENT_OTHER): Payer: Self-pay | Admitting: Plastic Surgery

## 2018-12-21 ENCOUNTER — Other Ambulatory Visit (HOSPITAL_COMMUNITY)
Admission: RE | Admit: 2018-12-21 | Discharge: 2018-12-21 | Disposition: A | Discharge status: Home / Self Care | Payer: Managed Care, Other (non HMO) | Source: Ambulatory Visit | Attending: Medical | Admitting: Medical

## 2018-12-21 ENCOUNTER — Encounter: Payer: Self-pay | Admitting: Medical

## 2018-12-21 ENCOUNTER — Ambulatory Visit: Payer: Managed Care, Other (non HMO) | Admitting: Medical

## 2018-12-21 VITALS — BP 98/76 | HR 65 | Temp 98.3°F | Resp 16 | Ht 64.0 in | Wt 240.0 lb

## 2018-12-21 DIAGNOSIS — N898 Other specified noninflammatory disorders of vagina: Secondary | ICD-10-CM

## 2018-12-21 DIAGNOSIS — Z202 Contact with and (suspected) exposure to infections with a predominantly sexual mode of transmission: Secondary | ICD-10-CM | POA: Diagnosis not present

## 2018-12-21 MED ORDER — FLUCONAZOLE 150 MG PO TABS: 150.0000 mg | ORAL_TABLET | Freq: Once | ORAL | 0 refills | Status: AC

## 2018-12-21 MED ORDER — FLUCONAZOLE 150 MG PO TABS: 150.0000 mg | ORAL_TABLET | Freq: Once | ORAL | 0 refills | Status: DC

## 2018-12-21 MED FILL — FLUCONAZOLE 150 MG TABS: 150 | 1 days supply | Qty: 1 | Fill #0

## 2018-12-21 NOTE — Progress Notes (Signed)
Subjective:    Patient ID: Debbie Alvarez, female    DOB: Mar 15, 1996, 23 y.o.   MRN: [id number]  HPI  Pt in with some slight white discharge/gray dc with some itching. Pt states had breast reduction last week.  She was on antibiotics around breast reduction. She only took 3 days of antibiotic. She states antibiotic upset her stomach so she stopped. She did not notice any difference with discharge on antibiotic.  Pt in April did have bacterial vaginosis. Her symptoms did resolve with treatment initially buthen symptoms returned.  On review pt states she had above signs and symptoms seemed to return  since august.  Pt does report odor at times.  lmp- 12/09/2017.   Review of Systems  Constitutional: Negative for chills, fatigue and fever.  Respiratory: Negative for cough, chest tightness, shortness of breath and wheezing.   Cardiovascular: Negative for chest pain and palpitations.  Gastrointestinal: Negative for abdominal pain.  Genitourinary: Positive for vaginal discharge. Negative for decreased urine volume, difficulty urinating, dysuria, flank pain, frequency, hematuria, menstrual problem, pelvic pain, urgency and vaginal pain.       Vaginal itch.  Musculoskeletal: Negative for back pain.   Past Medical History:  Diagnosis Date  . Anxiety   . Chronic neck and back pain 11/2018   due to macromastia  . Depression   . Macromastia 11/2018     Social History   Socioeconomic History  . Marital status: Single    Spouse name: Not on file  . Number of children: Not on file  . Years of education: Not on file  . Highest education level: Not on file  Occupational History    Comment: student  Social Needs  . Financial resource strain: Not on file  . Food insecurity:    Worry: Not on file    Inability: Not on file  . Transportation needs:    Medical: Not on file    Non-medical: Not on file  Tobacco Use  . Smoking status: Current Every Day Smoker    Years: 1.00    Types:  Cigars  . Smokeless tobacco: Never Used  . Tobacco comment: 1 Black & Mild/day  Substance and Sexual Activity  . Alcohol use: Yes    Comment: occasionally  . Drug use: Not Currently  . Sexual activity: Not Currently    Birth control/protection: Abstinence  Lifestyle  . Physical activity:    Days per week: Not on file    Minutes per session: Not on file  . Stress: Not on file  Relationships  . Social connections:    Talks on phone: Not on file    Gets together: Not on file    Attends religious service: Not on file    Active member of club or organization: Not on file    Attends meetings of clubs or organizations: Not on file    Relationship status: Not on file  . Intimate partner violence:    Fear of current or ex partner: Not on file    Emotionally abused: Not on file    Physically abused: Not on file    Forced sexual activity: Not on file  Other Topics Concern  . Not on file  Social History Narrative  . Not on file    Past Surgical History:  Procedure Laterality Date  . BREAST REDUCTION SURGERY Bilateral 12/11/2018   Procedure: BILATERAL BREAST REDUCTION;  Surgeon: Glenna Fellows, MD;  Location: [facility];  Service: Plastics;  Laterality: Bilateral;  .  TONSILLECTOMY/ADENOIDECTOMY/TURBINATE REDUCTION N/A 12/01/2014   Procedure: TONSILLECTOMY/ADENOIDECTOMY/TURBINATE REDUCTION;  Surgeon: Drema Halon, MD;  Location: [facility];  Service: ENT;  Laterality: N/A;  . WISDOM TOOTH EXTRACTION      Family History  Problem Relation Age of Onset  . Hypertension Maternal Grandmother     Allergies  Allergen Reactions  . Coconut Flavor Nausea Only    ABD. PAIN  . Hydrocodone-Acetaminophen Nausea And Vomiting    Current Outpatient Medications on File Prior to Visit  Medication Sig Dispense Refill  . hydrOXYzine (ATARAX/VISTARIL) 25 MG tablet Take 1 tablet (25 mg total) by mouth 3 (three) times daily as needed for anxiety. 30 tablet 0    . sertraline (ZOLOFT) 100 MG tablet Take 100 mg by mouth daily.     No current facility-administered medications on file prior to visit.     BP 98/76   Pulse 65   Temp 98.3 F (36.8 C) (Oral)   Resp 16   Ht 5\' 4"  (1.626 m)   Wt 240 lb (108.9 kg)   LMP 12/08/2018 Comment: regular cycle  SpO2 100%   BMI 41.20 kg/m       Objective:   Physical Exam  General Mental Status- Alert. General Appearance- Not in acute distress.    Chest and Lung Exam Auscultation: Breath Sounds:-Normal.  Cardiovascular Auscultation:Rythm- Regular. Murmurs & Other Heart Sounds:Auscultation of the heart reveals- No Murmurs.  Abdomen Inspection:-Inspeection Normal. Palpation/Percussion:Note:No mass. Palpation and Percussion of the abdomen reveal- Non Tender, Non Distended + BS, no rebound or guarding.  Back- no cva tenderness.   Neurologic Cranial Nerve exam:- CN III-XII intact(No nystagmus), symmetric smile.       Assessment & Plan:  With your described vaginal discharge and symptoms, I do think you have probable yeast infection versus bacterial vaginosis.  Will do urine ancillary studies to include BV, yeast, gonorrhea, chlamydia, and trichomonas.  Putting in future HIV and RPR all lab studies.  If your BV test comes back positive we will treat and then can retest 2 weeks post treatment since you report history of recurrent BV.  Presently would recommend Diflucan 1tablet for 1 day.  We will let you know results of urine ancillary when studies are in.  Follow-up date to be determined after lab review.  Esperanza Richters, PA-C

## 2018-12-21 NOTE — Patient Instructions (Signed)
With your described vaginal discharge and symptoms, I do think you have probable yeast infection versus bacterial vaginosis.  Will do urine ancillary studies to include BV, yeast, gonorrhea, chlamydia, and trichomonas.  Putting in future HIV and RPR all lab studies.  If your BV test comes back positive we will treat and then can retest 2 weeks post treatment since you report history of recurrent BV.  Presently would recommend Diflucan 1tablet for 1 day.  We will let you know results of urine ancillary when studies are in.  Follow-up date to be determined after lab review.

## 2018-12-24 LAB — URINE CYTOLOGY ANCILLARY ONLY
Chlamydia: NEGATIVE
Neisseria Gonorrhea: NEGATIVE
Trichomonas: NEGATIVE

## 2018-12-26 ENCOUNTER — Telehealth: Payer: Self-pay | Admitting: Medical

## 2018-12-26 ENCOUNTER — Encounter: Payer: Self-pay | Admitting: Medical

## 2018-12-26 LAB — URINE CYTOLOGY ANCILLARY ONLY: Candida vaginitis: POSITIVE — AB

## 2018-12-26 MED ORDER — METRONIDAZOLE 500 MG PO TABS: 500.0000 mg | ORAL_TABLET | Freq: Three times a day (TID) | ORAL | 0 refills | Status: DC

## 2018-12-26 NOTE — Telephone Encounter (Signed)
Rx flagyl sent to pt pharmacy. 

## 2019-01-15 ENCOUNTER — Encounter: Payer: Self-pay | Admitting: Family Medicine

## 2019-01-15 ENCOUNTER — Ambulatory Visit: Payer: Managed Care, Other (non HMO) | Admitting: Family Medicine

## 2019-01-15 VITALS — BP 116/70 | HR 101 | Temp 100.8°F | Resp 16 | Ht 64.0 in | Wt 237.8 lb

## 2019-01-15 DIAGNOSIS — L02212 Cutaneous abscess of back [any part, except buttock]: Secondary | ICD-10-CM | POA: Diagnosis not present

## 2019-01-15 DIAGNOSIS — J0140 Acute pansinusitis, unspecified: Secondary | ICD-10-CM

## 2019-01-15 LAB — CBC WITH DIFFERENTIAL/PLATELET
BASOS ABS: 0 10*3/uL (ref 0.0–0.1)
Basophils Relative: 0.4 % (ref 0.0–3.0)
Eosinophils Absolute: 0.1 10*3/uL (ref 0.0–0.7)
Eosinophils Relative: 1.2 % (ref 0.0–5.0)
HCT: 33.3 % — ABNORMAL LOW (ref 36.0–46.0)
HEMOGLOBIN: 11 g/dL — AB (ref 12.0–15.0)
Lymphocytes Relative: 16.9 % (ref 12.0–46.0)
Lymphs Abs: 1.9 10*3/uL (ref 0.7–4.0)
MCHC: 33 g/dL (ref 30.0–36.0)
MCV: 86.5 fl (ref 78.0–100.0)
MONOS PCT: 8.5 % (ref 3.0–12.0)
Monocytes Absolute: 1 10*3/uL (ref 0.1–1.0)
Neutro Abs: 8.3 10*3/uL — ABNORMAL HIGH (ref 1.4–7.7)
Neutrophils Relative %: 73 % (ref 43.0–77.0)
Platelets: 302 10*3/uL (ref 150.0–400.0)
RBC: 3.86 Mil/uL — ABNORMAL LOW (ref 3.87–5.11)
RDW: 16 % — ABNORMAL HIGH (ref 11.5–15.5)
WBC: 11.3 10*3/uL — ABNORMAL HIGH (ref 4.0–10.5)

## 2019-01-15 MED ORDER — CEPHALEXIN 500 MG PO CAPS: 500.0000 mg | ORAL_CAPSULE | Freq: Four times a day (QID) | ORAL | 0 refills | Status: DC

## 2019-01-15 NOTE — Assessment & Plan Note (Signed)
abx per orders Improving  rto prn

## 2019-01-15 NOTE — Assessment & Plan Note (Signed)
abx per orders epson salt soaks Check labs If pain worsens or no improvement return to office otherwise return Friday for recheck

## 2019-01-15 NOTE — Patient Instructions (Signed)
Skin Abscess  A skin abscess is an infected area on or under your skin that contains a collection of pus and other material. An abscess may also be called a furuncle, carbuncle, or boil. An abscess can occur in or on almost any part of your body. Some abscesses break open (rupture) on their own. Most continue to get worse unless they are treated. The infection can spread deeper into the body and eventually into your blood, which can make you feel ill. Treatment usually involves draining the abscess. What are the causes? An abscess occurs when germs, like bacteria, pass through your skin and cause an infection. This may be caused by:  A scrape or cut on your skin.  A puncture wound through your skin, including a needle injection or insect bite.  Blocked oil or sweat glands.  Blocked and infected hair follicles.  A cyst that forms beneath your skin (sebaceous cyst) and becomes infected. What increases the risk? This condition is more likely to develop in people who:  Have a weak body defense system (immune system).  Have diabetes.  Have dry and irritated skin.  Get frequent injections or use illegal IV drugs.  Have a foreign body in a wound, such as a splinter.  Have problems with their lymph system or veins. What are the signs or symptoms? Symptoms of this condition include:  A painful, firm bump under the skin.  A bump with pus at the top. This may break through the skin and drain. Other symptoms include:  Redness surrounding the abscess site.  Warmth.  Swelling of the lymph nodes (glands) near the abscess.  Tenderness.  A sore on the skin. How is this diagnosed? This condition may be diagnosed based on:  A physical exam.  Your medical history.  A sample of pus. This may be used to find out what is causing the infection.  Blood tests.  Imaging tests, such as an ultrasound, CT scan, or MRI. How is this treated? A small abscess that drains on its own may not  need treatment. Treatment for larger abscesses may include:  Moist heat or heat pack applied to the area several times a day.  A procedure to drain the abscess (incision and drainage).  Antibiotic medicines. For a severe abscess, you may first get antibiotics through an IV and then change to antibiotics by mouth. Follow these instructions at home: Medicines   Take over-the-counter and prescription medicines only as told by your health care provider.  If you were prescribed an antibiotic medicine, take it as told by your health care provider. Do not stop taking the antibiotic even if you start to feel better. Abscess care   If you have an abscess that has not drained, apply heat to the affected area. Use the heat source that your health care provider recommends, such as a moist heat pack or a heating pad. ? Place a towel between your skin and the heat source. ? Leave the heat on for 20-30 minutes. ? Remove the heat if your skin turns bright red. This is especially important if you are unable to feel pain, heat, or cold. You may have a greater risk of getting burned.  Follow instructions from your health care provider about how to take care of your abscess. Make sure you: ? Cover the abscess with a bandage (dressing). ? Change your dressing or gauze as told by your health care provider. ? Wash your hands with soap and water before you change the   dressing or gauze. If soap and water are not available, use hand sanitizer.  Check your abscess every day for signs of a worsening infection. Check for: ? More redness, swelling, or pain. ? More fluid or blood. ? Warmth. ? More pus or a bad smell. General instructions  To avoid spreading the infection: ? Do not share personal care items, towels, or hot tubs with others. ? Avoid making skin contact with other people.  Keep all follow-up visits as told by your health care provider. This is important. Contact a health care provider if you  have:  More redness, swelling, or pain around your abscess.  More fluid or blood coming from your abscess.  Warm skin around your abscess.  More pus or a bad smell coming from your abscess.  A fever.  Muscle aches.  Chills or a general ill feeling. Get help right away if you:  Have severe pain.  See red streaks on your skin spreading away from the abscess. Summary  A skin abscess is an infected area on or under your skin that contains a collection of pus and other material.  A small abscess that drains on its own may not need treatment.  Treatment for larger abscesses may include having a procedure to drain the abscess and taking an antibiotic. This information is not intended to replace advice given to you by your health care provider. Make sure you discuss any questions you have with your health care provider. Document Released: 08/31/2005 Document Revised: 01/04/2018 Document Reviewed: 01/04/2018 Elsevier Interactive Patient Education  2019 [employer]vier Inc[employer]  

## 2019-01-15 NOTE — Progress Notes (Signed)
Patient ID: Debbie Alvarez, female    DOB: [birth year]  Age: 23 y.o. MRN: [id number]    Subjective:  Subjective  HPI Debbie Alvarez presents for lump on R low back x 2 days -- it is tender to touch -- [place]  Thought she may have been bit by something.  She is also getting over sinus symptoms.    Review of Systems  Constitutional: Negative for appetite change, diaphoresis, fatigue and unexpected weight change.  HENT: Positive for congestion, rhinorrhea and sinus pressure. Negative for sinus pain.   Eyes: Negative for pain, redness and visual disturbance.  Respiratory: Negative for cough, chest tightness, shortness of breath and wheezing.   Cardiovascular: Negative for chest pain, palpitations and leg swelling.  Endocrine: Negative for cold intolerance, heat intolerance, polydipsia, polyphagia and polyuria.  Genitourinary: Negative for difficulty urinating, dysuria and frequency.  Neurological: Negative for dizziness, light-headedness, numbness and headaches.    History Past Medical History:  Diagnosis Date  . Anxiety   . Chronic neck and back pain 11/2018   due to macromastia  . Depression   . Macromastia 11/2018    She has a past surgical history that includes Tonsillectomy/adenoidectomy/turbinate reduction (N/A, 12/01/2014); Wisdom tooth extraction; and Breast reduction surgery (Bilateral, 12/11/2018).   Her family history includes Hypertension in her maternal grandmother.She reports that she has been smoking cigars. She has smoked for the past 1.00 year. She has never used smokeless tobacco. She reports current alcohol use. She reports previous drug use.  Current Outpatient Medications on File Prior to Visit  Medication Sig Dispense Refill  . hydrOXYzine (ATARAX/VISTARIL) 25 MG tablet Take 1 tablet (25 mg total) by mouth 3 (three) times daily as needed for anxiety. 30 tablet 0  . sertraline (ZOLOFT) 100 MG tablet Take 100 mg by mouth daily.      No current facility-administered medications on file prior to visit.      Objective:  Objective  Physical Exam Vitals signs and nursing note reviewed.  Constitutional:      Appearance: She is well-developed.  HENT:     Head: Normocephalic and atraumatic.     Nose:     Right Turbinates: Swollen.     Left Turbinates: Swollen.     Right Sinus: Maxillary sinus tenderness and frontal sinus tenderness present.     Left Sinus: Maxillary sinus tenderness and frontal sinus tenderness present.  Eyes:     Conjunctiva/sclera: Conjunctivae normal.  Neck:     Musculoskeletal: Normal range of motion and neck supple.     Thyroid: No thyromegaly.     Vascular: No carotid bruit or JVD.  Cardiovascular:     Rate and Rhythm: Normal rate and regular rhythm.     Heart sounds: Normal heart sounds. No murmur.  Pulmonary:     Effort: Pulmonary effort is normal. No respiratory distress.     Breath sounds: Normal breath sounds. No wheezing or rales.  Chest:     Chest wall: No tenderness.  Musculoskeletal:       Arms:  Neurological:     Mental Status: She is alert and oriented to person, place, and time.    BP 116/70 (BP Location: Left Arm, Cuff Size: Large)   Pulse (!) 101   Temp (!) 100.8 F (38.2 C) (Oral)   Resp 16   Ht 5\' 4"  (1.626 m)   Wt 237 lb 12.8 oz (107.9 kg)   LMP 01/14/2019   SpO2 98%  BMI 40.82 kg/m  Wt Readings from Last 3 Encounters:  01/15/19 237 lb 12.8 oz (107.9 kg)  12/21/18 240 lb (108.9 kg)  12/11/18 240 lb 8.4 oz (109.1 kg)     Lab Results  Component Value Date   WBC 11.3 (H) 01/15/2019   HGB 11.0 (L) 01/15/2019   HCT 33.3 (L) 01/15/2019   PLT 302.0 01/15/2019   GLUCOSE 75 03/26/2018   CHOL 204 (H) 03/26/2018   TRIG 71.0 03/26/2018   HDL 58.70 03/26/2018   LDLDIRECT 140.0 03/21/2016   LDLCALC 131 (H) 03/26/2018   ALT 18 03/26/2018   AST 18 03/26/2018   NA 137 03/26/2018   K 4.1 03/26/2018   CL 103 03/26/2018   CREATININE 0.69 03/26/2018   BUN  12 03/26/2018   CO2 27 03/26/2018   TSH 0.81 03/26/2018    No results found.   Assessment & Plan:  Plan  I have discontinued Debbie Alvarez's metroNIDAZOLE. I am also having her start on cephALEXin. Additionally, I am having her maintain her hydrOXYzine and sertraline.  Meds ordered this encounter  Medications  . cephALEXin (KEFLEX) 500 MG capsule    Sig: Take 1 capsule (500 mg total) by mouth 4 (four) times daily.    Dispense:  40 capsule    Refill:  0    Problem List Items Addressed This Visit      Unprioritized   Abscess of back - Primary    abx per orders epson salt soaks Check labs If pain worsens or no improvement return to office otherwise return Friday for recheck      Relevant Medications   cephALEXin (KEFLEX) 500 MG capsule   Other Relevant Orders   CBC with Differential/Platelet (Completed)   Acute non-recurrent pansinusitis    abx per orders Improving  rto prn      Relevant Medications   cephALEXin (KEFLEX) 500 MG capsule       Follow-up: Return in about 3 days (around 01/18/2019), or if symptoms worsen or fail to improve, for recheck .  Donato SchultzYvonne R Lowne Chase, DO

## 2019-01-17 ENCOUNTER — Telehealth: Payer: Self-pay

## 2019-01-17 ENCOUNTER — Encounter (HOSPITAL_BASED_OUTPATIENT_CLINIC_OR_DEPARTMENT_OTHER): Payer: Self-pay | Admitting: *Deleted

## 2019-01-17 ENCOUNTER — Emergency Department (HOSPITAL_BASED_OUTPATIENT_CLINIC_OR_DEPARTMENT_OTHER)
Admission: EM | Admit: 2019-01-17 | Discharge: 2019-01-17 | Disposition: A | Discharge status: Home / Self Care | Payer: Managed Care, Other (non HMO) | Attending: Emergency Medicine | Admitting: Emergency Medicine

## 2019-01-17 ENCOUNTER — Other Ambulatory Visit: Payer: Self-pay

## 2019-01-17 DIAGNOSIS — L03312 Cellulitis of back [any part except buttock]: Secondary | ICD-10-CM | POA: Diagnosis not present

## 2019-01-17 DIAGNOSIS — F17290 Nicotine dependence, other tobacco product, uncomplicated: Secondary | ICD-10-CM | POA: Insufficient documentation

## 2019-01-17 DIAGNOSIS — Z79899 Other long term (current) drug therapy: Secondary | ICD-10-CM | POA: Diagnosis not present

## 2019-01-17 DIAGNOSIS — R222 Localized swelling, mass and lump, trunk: Secondary | ICD-10-CM | POA: Diagnosis present

## 2019-01-17 MED ORDER — OXYCODONE-ACETAMINOPHEN 5-325 MG PO TABS: 1.0000 | ORAL_TABLET | ORAL | 0 refills | Status: DC | PRN

## 2019-01-17 MED ORDER — DOXYCYCLINE HYCLATE 100 MG PO CAPS: 100.0000 mg | ORAL_CAPSULE | Freq: Two times a day (BID) | ORAL | 0 refills | Status: DC

## 2019-01-17 NOTE — ED Notes (Signed)
ED Provider at bedside with US 

## 2019-01-17 NOTE — Telephone Encounter (Signed)
Left message on machine that we apologize about not having an appt for today and saw where she had checked in the ED and to please call and give status if she is released tonight.

## 2019-01-17 NOTE — Discharge Instructions (Signed)
Continue Keflex four times daily Start Doxycycline twice daily Take Percocet as needed for severe pain. Do not drive, work, or drink alcohol while taking this medicine Please follow up with your doctor Return if you are worsening

## 2019-01-17 NOTE — ED Provider Notes (Signed)
[facility] EMERGENCY DEPARTMENT Provider Note   CSN: [id number] Arrival date & time: 01/17/19  1459     History   Chief Complaint Chief Complaint  Patient presents with  . Abscess    HPI Debbie Alvarez is a 23 y.o. female who presents with an abscess. PMH significant for chronic neck and back pain, depression. She states that she's had a gradually worsening painful area on her mid back for the past 5 days.  She went to her primary doctor's office 2 days ago and was started on Keflex for possible abscess.  She states that she has been taking this medicine but the area has been getting more painful and feels like it is getting worse.  She has never had this before.  She had a low-grade fever of 100.8 several days ago but none currently.  She denies any other systemic symptoms.  HPI  Past Medical History:  Diagnosis Date  . Anxiety   . Chronic neck and back pain 11/2018   due to macromastia  . Depression   . Macromastia 11/2018    Patient Active Problem List   Diagnosis Date Noted  . Acute non-recurrent pansinusitis 01/15/2019  . Abscess of back 01/15/2019  . High risk heterosexual behavior 03/26/2018  . Initiation of oral contraception 03/26/2018  . Preventative health care 03/26/2018  . Severe recurrent major depression without psychotic features (HCC) 03/05/2018  . Premenstrual syndrome 01/28/2015    Past Surgical History:  Procedure Laterality Date  . BREAST REDUCTION SURGERY Bilateral 12/11/2018   Procedure: BILATERAL BREAST REDUCTION;  Surgeon: Glenna Fellows, MD;  Location: [facility];  Service: Plastics;  Laterality: Bilateral;  . TONSILLECTOMY/ADENOIDECTOMY/TURBINATE REDUCTION N/A 12/01/2014   Procedure: TONSILLECTOMY/ADENOIDECTOMY/TURBINATE REDUCTION;  Surgeon: Drema Halon, MD;  Location:  SURGERY CENTER;  Service: ENT;  Laterality: N/A;  . WISDOM TOOTH EXTRACTION       OB History   No obstetric history on  file.      Home Medications    Prior to Admission medications   Medication Sig Start Date End Date Taking? Authorizing Provider  acetaminophen (TYLENOL) 500 MG tablet Take 500 mg by mouth every 6 (six) hours as needed.   Yes [provider]  cephALEXin (KEFLEX) 500 MG capsule Take 1 capsule (500 mg total) by mouth 4 (four) times daily. 01/15/19   Donato Schultz, DO  hydrOXYzine (ATARAX/VISTARIL) 25 MG tablet Take 1 tablet (25 mg total) by mouth 3 (three) times daily as needed for anxiety. 03/08/18   Money, Gerlene Burdock, FNP  sertraline (ZOLOFT) 100 MG tablet Take 100 mg by mouth daily.    [provider]    Family History Family History  Problem Relation Age of Onset  . Hypertension Maternal Grandmother     Social History Social History   Tobacco Use  . Smoking status: Current Every Day Smoker    Years: 1.00    Types: Cigars  . Smokeless tobacco: Never Used  . Tobacco comment: 1 Black & Mild/day  Substance Use Topics  . Alcohol use: Yes    Comment: occasionally  . Drug use: Not Currently     Allergies   Coconut flavor and Hydrocodone-acetaminophen   Review of Systems Review of Systems  Constitutional: Negative for fever.     Physical Exam Updated Vital Signs BP (!) 106/59 (BP Location: Right Arm)   Pulse 87   Temp 98.3 F (36.8 C)   Resp 16   Ht 5\' 4"  (  1.626 m)   Wt 107 kg   LMP 01/14/2019   SpO2 100%   BMI 40.49 kg/m   Physical Exam Vitals signs and nursing note reviewed.  Constitutional:      General: She is not in acute distress.    Appearance: Normal appearance. She is well-developed.     Comments: Calm and cooperative  HENT:     Head: Normocephalic and atraumatic.  Eyes:     General: No scleral icterus.       Right eye: No discharge.        Left eye: No discharge.     Conjunctiva/sclera: Conjunctivae normal.     Pupils: Pupils are equal, round, and reactive to light.  Neck:     Musculoskeletal: Normal range of motion.   Cardiovascular:     Rate and Rhythm: Normal rate.  Pulmonary:     Effort: Pulmonary effort is normal. No respiratory distress.  Abdominal:     General: There is no distension.  Skin:    General: Skin is warm and dry.     Comments: Pustule over the right mid back with surrounding erythema and induration. No fluctuance or drainage  Neurological:     Mental Status: She is alert and oriented to person, place, and time.  Psychiatric:        Behavior: Behavior normal.      ED Treatments / Results  Labs (all labs ordered are listed, but only abnormal results are displayed) Labs Reviewed - No data to display  EKG None  Radiology No results found.  Procedures Procedures (including critical care time)  Medications Ordered in ED Medications - No data to display   Initial Impression / Assessment and Plan / ED Course  I have reviewed the triage vital signs and the nursing notes.  Pertinent labs & imaging results that were available during my care of the patient were reviewed by me and considered in my medical decision making (see chart for details).  23 year old female presents with an possible abscess on her back.  Her vital signs are normal.  There is redness and induration with a pustule over there back.  Soft tissue ultrasound was used to evaluate the area which did not show underlying abscess and was consistent with cellulitis.  At this point she has been on antibiotics for 2 days.  We will add Doxy to cover for MRSA and stronger pain medicine since her main issue is pain control.  The area was outlined with a marker by nursing.  She was encouraged to follow-up with her doctor and return if worsening.  Final Clinical Impressions(s) / ED Diagnoses   Final diagnoses:  Cellulitis of back    ED Discharge Orders         Ordered    doxycycline (VIBRAMYCIN) 100 MG capsule  2 times daily,   Status:  Discontinued     01/17/19 1754    oxyCODONE-acetaminophen (PERCOCET/ROXICET)  5-325 MG tablet  Every 4 hours PRN,   Status:  Discontinued     01/17/19 1754    doxycycline (VIBRAMYCIN) 100 MG capsule  2 times daily     01/17/19 1758    oxyCODONE-acetaminophen (PERCOCET/ROXICET) 5-325 MG tablet  Every 4 hours PRN     01/17/19 1758           Bethel BornGekas, Kelly Marie, PA-C 01/17/19 1826    Maia PlanLong, Joshua G, MD 01/18/19 1025

## 2019-01-17 NOTE — Telephone Encounter (Signed)
Pt's mother Nunzio Cory called again to inquire about pt being seen by Dr. Laury Axon today regarding abscess on her back which has gotten worse or if Dr. Laury Axon can send an urgent referral for pt to be seen by a specialist today. Please advise. PQ#[phone]

## 2019-01-17 NOTE — ED Notes (Signed)
Skin marked around abscess to back

## 2019-01-17 NOTE — ED Triage Notes (Signed)
Pt seen here yesterday for same , c/o abscess to buttock

## 2019-01-17 NOTE — Telephone Encounter (Signed)
Copied from CRM [id number]. Topic: Appointment Scheduling - Scheduling Inquiry for Clinic >> Jan 17, 2019  7:18 AM Debbie Alvarez wrote: Reason for CRM:  the abscess on her back has gotten bigger and pain is worst. Pt wants to see dr Zola Button today

## 2019-01-19 ENCOUNTER — Encounter: Payer: Self-pay | Admitting: Medical

## 2019-01-20 ENCOUNTER — Emergency Department (HOSPITAL_BASED_OUTPATIENT_CLINIC_OR_DEPARTMENT_OTHER)
Admission: EM | Admit: 2019-01-20 | Discharge: 2019-01-20 | Disposition: A | Discharge status: Home / Self Care | Payer: Managed Care, Other (non HMO) | Attending: Emergency Medicine | Admitting: Emergency Medicine

## 2019-01-20 ENCOUNTER — Other Ambulatory Visit: Payer: Self-pay

## 2019-01-20 ENCOUNTER — Encounter (HOSPITAL_BASED_OUTPATIENT_CLINIC_OR_DEPARTMENT_OTHER): Payer: Self-pay

## 2019-01-20 DIAGNOSIS — F17210 Nicotine dependence, cigarettes, uncomplicated: Secondary | ICD-10-CM | POA: Insufficient documentation

## 2019-01-20 DIAGNOSIS — Z79899 Other long term (current) drug therapy: Secondary | ICD-10-CM | POA: Insufficient documentation

## 2019-01-20 DIAGNOSIS — L02212 Cutaneous abscess of back [any part, except buttock]: Secondary | ICD-10-CM | POA: Insufficient documentation

## 2019-01-20 DIAGNOSIS — L03312 Cellulitis of back [any part except buttock]: Secondary | ICD-10-CM | POA: Diagnosis present

## 2019-01-20 MED ORDER — LIDOCAINE-EPINEPHRINE (PF) 2 %-1:200000 IJ SOLN
20.0000 mL | Freq: Once | INTRAMUSCULAR | Status: AC
  Administered 2019-01-20: 10 mL
  Filled 2019-01-20: qty 20

## 2019-01-20 MED ORDER — LIDOCAINE-EPINEPHRINE (PF) 2 %-1:200000 IJ SOLN
INTRAMUSCULAR | Status: AC
  Administered 2019-01-20: 10 mL
  Filled 2019-01-20: qty 20

## 2019-01-20 NOTE — ED Triage Notes (Signed)
Pt was seen here three days ago and diagnosed with cellulitis. Pt states the redness has spread outside of the marked area.

## 2019-01-20 NOTE — Discharge Instructions (Addendum)
Complete your course of antibiotics.   Return to the ER for new or worsening symptoms

## 2019-01-20 NOTE — ED Provider Notes (Signed)
[facility] EMERGENCY DEPARTMENT Provider Note   CSN: [id number] Arrival date & time: 01/20/19  1802     History   Chief Complaint Chief Complaint  Patient presents with  . Wound Check    HPI Debbie Alvarez is a 23 y.o. female.  HPI Patient is a 23 year old female who presents to the emergency department because of ongoing low back pain.  She was seen in the emergency department 3 days ago and was found to have a developing cellulitis of her low back.  She was placed on warm compresses and doxycycline.  At that point she had previously been on Keflex by an urgent care for 2 days.  She presents back to the emergency department today reporting increasing swelling and pain.  No fevers.  No drainage.  She feels like the area is worsening outside the skin marker lines placed for her cellulitis 3 days ago.  Her pain is severe in severity.   Past Medical History:  Diagnosis Date  . Anxiety   . Chronic neck and back pain 11/2018   due to macromastia  . Depression   . Macromastia 11/2018    Patient Active Problem List   Diagnosis Date Noted  . Acute non-recurrent pansinusitis 01/15/2019  . Abscess of back 01/15/2019  . High risk heterosexual behavior 03/26/2018  . Initiation of oral contraception 03/26/2018  . Preventative health care 03/26/2018  . Severe recurrent major depression without psychotic features (HCC) 03/05/2018  . Premenstrual syndrome 01/28/2015    Past Surgical History:  Procedure Laterality Date  . BREAST REDUCTION SURGERY Bilateral 12/11/2018   Procedure: BILATERAL BREAST REDUCTION;  Surgeon: Glenna Fellows, MD;  Location: [facility];  Service: Plastics;  Laterality: Bilateral;  . TONSILLECTOMY/ADENOIDECTOMY/TURBINATE REDUCTION N/A 12/01/2014   Procedure: TONSILLECTOMY/ADENOIDECTOMY/TURBINATE REDUCTION;  Surgeon: Drema Halon, MD;  Location: [facility];  Service: ENT;  Laterality: N/A;  . WISDOM TOOTH  EXTRACTION       OB History   No obstetric history on file.      Home Medications    Prior to Admission medications   Medication Sig Start Date End Date Taking? Authorizing Provider  acetaminophen (TYLENOL) 500 MG tablet Take 500 mg by mouth every 6 (six) hours as needed.    [provider]  cephALEXin (KEFLEX) 500 MG capsule Take 1 capsule (500 mg total) by mouth 4 (four) times daily. 01/15/19   Donato Schultz, DO  doxycycline (VIBRAMYCIN) 100 MG capsule Take 1 capsule (100 mg total) by mouth 2 (two) times daily. 01/17/19   Bethel Born, PA-C  hydrOXYzine (ATARAX/VISTARIL) 25 MG tablet Take 1 tablet (25 mg total) by mouth 3 (three) times daily as needed for anxiety. 03/08/18   Money, Gerlene Burdock, FNP  oxyCODONE-acetaminophen (PERCOCET/ROXICET) 5-325 MG tablet Take 1 tablet by mouth every 4 (four) hours as needed for severe pain. 01/17/19   Bethel Born, PA-C  sertraline (ZOLOFT) 100 MG tablet Take 100 mg by mouth daily.    [provider]    Family History Family History  Problem Relation Age of Onset  . Hypertension Maternal Grandmother     Social History Social History   Tobacco Use  . Smoking status: Current Every Day Smoker    Years: 1.00    Types: Cigars  . Smokeless tobacco: Never Used  . Tobacco comment: 1 Black & Mild/day  Substance Use Topics  . Alcohol use: Yes    Comment: occasionally  . Drug use:  Not Currently     Allergies   Coconut flavor and Hydrocodone-acetaminophen   Review of Systems Review of Systems  All other systems reviewed and are negative.    Physical Exam Updated Vital Signs BP 116/77 (BP Location: Left Arm)   Pulse 82   Temp 98.1 F (36.7 C)   Resp 20   Ht 5\' 4"  (1.626 m)   Wt 107.5 kg   LMP 01/14/2019   SpO2 98%   BMI 40.68 kg/m   Physical Exam Vitals signs and nursing note reviewed.  Constitutional:      Appearance: She is well-developed.  HENT:     Head: Normocephalic.  Neck:      Musculoskeletal: Normal range of motion.  Pulmonary:     Effort: Pulmonary effort is normal.  Abdominal:     General: There is no distension.  Musculoskeletal: Normal range of motion.     Comments: Large area of induration across to the right low back with associated fluctuance.  No drainage.  Some erythema and warmth.  The induration has moved outside the skin marker lines  Neurological:     Mental Status: She is alert and oriented to person, place, and time.      ED Treatments / Results  Labs (all labs ordered are listed, but only abnormal results are displayed) Labs Reviewed - No data to display  EKG None  Radiology No results found.  Procedures .Marland KitchenIncision and Drainage Performed by: Azalia Bilis, MD Authorized by: Azalia Bilis, MD    INCISION AND DRAINAGE Performed by: Azalia Bilis Consent: Verbal consent obtained. Risks and benefits: risks, benefits and alternatives were discussed Time out performed prior to procedure Type: abscess Body area: low back Anesthesia: local infiltration Incision was made with a scalpel. Local anesthetic: lidocaine 2 % with epinephrine Anesthetic total: 5 ml Complexity: complex Blunt dissection to break up loculations Drainage: purulent Drainage amount: Large Packing material: None Patient tolerance: Patient tolerated the procedure well with no immediate complications.     Medications Ordered in ED Medications  lidocaine-EPINEPHrine (XYLOCAINE W/EPI) 2 %-1:200000 (PF) injection 20 mL (10 mLs Infiltration Given 01/20/19 1828)     Initial Impression / Assessment and Plan / ED Course  I have reviewed the triage vital signs and the nursing notes.  Pertinent labs & imaging results that were available during my care of the patient were reviewed by me and considered in my medical decision making (see chart for details).     A large amount of purulent material was removed.  She feels much better at this time.  Attempted packing  placement given the amount of purulent material removed however the patient did not tolerate the procedure and elected not to have packing.  This is not unreasonable.  Patient will complete her course of antibiotics.  She understands return to the ER for new or worsening symptoms.  Final Clinical Impressions(s) / ED Diagnoses   Final diagnoses:  Abscess of lower back    ED Discharge Orders    None       Azalia Bilis, MD 01/20/19 1909

## 2019-01-21 ENCOUNTER — Telehealth: Payer: Self-pay | Admitting: Medical

## 2019-01-21 ENCOUNTER — Encounter: Payer: Self-pay | Admitting: *Deleted

## 2019-01-21 ENCOUNTER — Other Ambulatory Visit: Payer: Self-pay | Admitting: *Deleted

## 2019-01-21 DIAGNOSIS — L02212 Cutaneous abscess of back [any part, except buttock]: Secondary | ICD-10-CM

## 2019-01-21 MED ORDER — METRONIDAZOLE 500 MG PO TABS: 500.0000 mg | ORAL_TABLET | Freq: Three times a day (TID) | ORAL | 0 refills | Status: DC

## 2019-01-21 NOTE — Telephone Encounter (Signed)
Rx flagyl sent to pt pharmacy. 

## 2019-01-21 NOTE — Telephone Encounter (Signed)
Rx flagyl sent 2nd time for bv at pt my chart request.

## 2019-01-22 ENCOUNTER — Encounter: Payer: Self-pay | Admitting: Family Medicine

## 2019-04-01 ENCOUNTER — Ambulatory Visit (INDEPENDENT_AMBULATORY_CARE_PROVIDER_SITE_OTHER): Payer: Managed Care, Other (non HMO) | Admitting: Family Medicine

## 2019-04-01 ENCOUNTER — Other Ambulatory Visit: Payer: Self-pay

## 2019-04-01 ENCOUNTER — Encounter: Payer: Self-pay | Admitting: Family Medicine

## 2019-04-01 DIAGNOSIS — B009 Herpesviral infection, unspecified: Secondary | ICD-10-CM | POA: Diagnosis not present

## 2019-04-01 MED ORDER — VALACYCLOVIR HCL 1 G PO TABS: 1000.0000 mg | ORAL_TABLET | Freq: Three times a day (TID) | ORAL | 0 refills | Status: DC

## 2019-04-01 NOTE — Progress Notes (Signed)
Virtual Visit via Video Note  I connected with [name] on 04/01/19 at  3:45 PM EDT by a video enabled telemedicine application and verified that I am speaking with the correct person using two identifiers.   I discussed the limitations of evaluation and management by telemedicine and the availability of in person appointments. The patient expressed understanding and agreed to proceed.  History of Present Illness: Pt is home with her boyfriend.   C/o hsv breakout--- she was dx at urgent care and given meds in March but it has started back --- pain, buring --- she is pretty sure its the same thing.  No vag d/c , no abd pain    She is tryiing to get in with a gyn but none are taking new pt right now due to covid.     Provider is working from home   Past Medical History:  Diagnosis Date  . Anxiety   . Chronic neck and back pain 11/2018   due to macromastia  . Depression   . Macromastia 11/2018   Outpatient Encounter Medications as of 04/01/2019  Medication Sig  . acetaminophen (TYLENOL) 500 MG tablet Take 500 mg by mouth every 6 (six) hours as needed.  . cephALEXin (KEFLEX) 500 MG capsule Take 1 capsule (500 mg total) by mouth 4 (four) times daily.  Marland Kitchen doxycycline (VIBRAMYCIN) 100 MG capsule Take 1 capsule (100 mg total) by mouth 2 (two) times daily.  . hydrOXYzine (ATARAX/VISTARIL) 25 MG tablet Take 1 tablet (25 mg total) by mouth 3 (three) times daily as needed for anxiety.  . metroNIDAZOLE (FLAGYL) 500 MG tablet Take 1 tablet (500 mg total) by mouth 3 (three) times daily.  Marland Kitchen oxyCODONE-acetaminophen (PERCOCET/ROXICET) 5-325 MG tablet Take 1 tablet by mouth every 4 (four) hours as needed for severe pain.  Marland Kitchen sertraline (ZOLOFT) 100 MG tablet Take 100 mg by mouth daily.  . valACYclovir (VALTREX) 1000 MG tablet Take 1 tablet (1,000 mg total) by mouth 3 (three) times daily.   No facility-administered encounter medications on file as of 04/01/2019.    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  Dispense: 30 tablet; Refill: 0   Follow Up Instructions:    I discussed the assessment and treatment plan with the patient. The patient was provided an opportunity to ask questions and all were answered. The patient agreed with the plan and demonstrated an understanding of the instructions.   The patient was advised to call back or seek an in-person evaluation if the symptoms worsen or if the condition fails to improve as anticipated.  I provided 20 minutes of non-face-to-face time during this encounter.   Donato Schultz, DO

## 2019-04-12 ENCOUNTER — Other Ambulatory Visit: Payer: Self-pay | Admitting: Family Medicine

## 2019-04-12 ENCOUNTER — Encounter: Payer: Self-pay | Admitting: Family Medicine

## 2019-04-12 DIAGNOSIS — B009 Herpesviral infection, unspecified: Secondary | ICD-10-CM

## 2019-04-12 MED ORDER — VALACYCLOVIR HCL 1 G PO TABS: 1000.0000 mg | ORAL_TABLET | Freq: Every day | ORAL | 3 refills | Status: DC

## 2019-06-23 ENCOUNTER — Encounter: Payer: Self-pay | Admitting: Family Medicine

## 2019-08-08 ENCOUNTER — Encounter: Payer: Self-pay | Admitting: Family Medicine

## 2019-08-08 ENCOUNTER — Other Ambulatory Visit: Payer: Self-pay

## 2019-08-09 ENCOUNTER — Ambulatory Visit: Payer: Managed Care, Other (non HMO) | Admitting: Family Medicine

## 2019-09-02 ENCOUNTER — Other Ambulatory Visit: Payer: Self-pay | Admitting: Family Medicine

## 2019-09-02 ENCOUNTER — Other Ambulatory Visit: Payer: Self-pay

## 2019-09-02 ENCOUNTER — Other Ambulatory Visit (HOSPITAL_COMMUNITY)
Admission: RE | Admit: 2019-09-02 | Discharge: 2019-09-02 | Disposition: A | Discharge status: Home / Self Care | Payer: Managed Care, Other (non HMO) | Source: Ambulatory Visit | Attending: Family Medicine | Admitting: Family Medicine

## 2019-09-02 ENCOUNTER — Encounter: Payer: Self-pay | Admitting: Family Medicine

## 2019-09-02 ENCOUNTER — Ambulatory Visit: Payer: Managed Care, Other (non HMO) | Admitting: Family Medicine

## 2019-09-02 VITALS — BP 122/80 | HR 86 | Temp 97.7°F | Resp 18 | Ht 64.0 in | Wt 248.2 lb

## 2019-09-02 DIAGNOSIS — Z30011 Encounter for initial prescription of contraceptive pills: Secondary | ICD-10-CM | POA: Diagnosis not present

## 2019-09-02 DIAGNOSIS — R8290 Unspecified abnormal findings in urine: Secondary | ICD-10-CM | POA: Diagnosis not present

## 2019-09-02 DIAGNOSIS — B9689 Other specified bacterial agents as the cause of diseases classified elsewhere: Secondary | ICD-10-CM

## 2019-09-02 DIAGNOSIS — N760 Acute vaginitis: Secondary | ICD-10-CM | POA: Diagnosis not present

## 2019-09-02 DIAGNOSIS — Z202 Contact with and (suspected) exposure to infections with a predominantly sexual mode of transmission: Secondary | ICD-10-CM

## 2019-09-02 LAB — POCT URINALYSIS DIP (MANUAL ENTRY)
Bilirubin, UA: NEGATIVE
Blood, UA: NEGATIVE
Glucose, UA: NEGATIVE mg/dL
Ketones, POC UA: NEGATIVE mg/dL
Nitrite, UA: NEGATIVE
Protein Ur, POC: NEGATIVE mg/dL
Spec Grav, UA: >=1.03 — AB (ref 1.010–1.025)
Urobilinogen, UA: 0.2 E.U./dL
pH, UA: 6 (ref 5.0–8.0)

## 2019-09-02 LAB — POCT URINE PREGNANCY: Preg Test, Ur: NEGATIVE

## 2019-09-02 MED ORDER — LO LOESTRIN FE 1 MG-10 MCG / 10 MCG PO TABS: 1.0000 | ORAL_TABLET | Freq: Every day | ORAL | 1 refills | Status: DC

## 2019-09-02 MED ORDER — METRONIDAZOLE 0.75 % VA GEL: 1.0000 | Freq: Every day | VAGINAL | 0 refills | Status: DC

## 2019-09-02 MED FILL — metroNIDAZOLE 0.75 % GEL: 0.75 | 7 days supply | Qty: 70 | Fill #0

## 2019-09-02 MED FILL — LO LOESTRIN FE 1-10 TABLET: 1 MG-10 MCG | 28 days supply | Qty: 28 | Fill #0

## 2019-09-02 NOTE — Progress Notes (Signed)
Patient ID: Debbie Alvarez, female    DOB: 1996/08/16  Age: 23 y.o. MRN: [id number]    Subjective:  Subjective  HPI Debbie Alvarez presents for painful intercourse, d/c and odor --- she has a hx of recurrent bv.    No dysuria, no back pain Pt has yellow/ white d/c with fishy odor    Review of Systems  Constitutional: Negative for appetite change, diaphoresis, fatigue and unexpected weight change.  Eyes: Negative for pain, redness and visual disturbance.  Respiratory: Negative for cough, chest tightness, shortness of breath and wheezing.   Cardiovascular: Negative for chest pain, palpitations and leg swelling.  Endocrine: Negative for cold intolerance, heat intolerance, polydipsia, polyphagia and polyuria.  Genitourinary: Positive for vaginal discharge. Negative for difficulty urinating, dysuria, frequency, pelvic pain, vaginal bleeding and vaginal pain.  Neurological: Negative for dizziness, light-headedness, numbness and headaches.    History Past Medical History:  Diagnosis Date  . Anxiety   . Chronic neck and back pain 11/2018   due to macromastia  . Depression   . Macromastia 11/2018    She has a past surgical history that includes Tonsillectomy/adenoidectomy/turbinate reduction (N/A, 12/01/2014); Wisdom tooth extraction; and Breast reduction surgery (Bilateral, 12/11/2018).   Her family history includes Hypertension in her maternal grandmother.She reports that she has been smoking cigars. She has smoked for the past 1.00 year. She has never used smokeless tobacco. She reports current alcohol use. She reports previous drug use.  Current Outpatient Medications on File Prior to Visit  Medication Sig Dispense Refill  . acetaminophen (TYLENOL) 500 MG tablet Take 500 mg by mouth every 6 (six) hours as needed.    . sertraline (ZOLOFT) 100 MG tablet Take 100 mg by mouth daily.    . valACYclovir (VALTREX) 1000 MG tablet Take 1 tablet (1,000 mg total) by mouth daily. 90 tablet 3  .  cephALEXin (KEFLEX) 500 MG capsule Take 1 capsule (500 mg total) by mouth 4 (four) times daily. (Patient not taking: Reported on 09/02/2019) 40 capsule 0  . doxycycline (VIBRAMYCIN) 100 MG capsule Take 1 capsule (100 mg total) by mouth 2 (two) times daily. (Patient not taking: Reported on 09/02/2019) 14 capsule 0  . hydrOXYzine (ATARAX/VISTARIL) 25 MG tablet Take 1 tablet (25 mg total) by mouth 3 (three) times daily as needed for anxiety. 30 tablet 0  . metroNIDAZOLE (FLAGYL) 500 MG tablet Take 1 tablet (500 mg total) by mouth 3 (three) times daily. (Patient not taking: Reported on 09/02/2019) 21 tablet 0  . oxyCODONE-acetaminophen (PERCOCET/ROXICET) 5-325 MG tablet Take 1 tablet by mouth every 4 (four) hours as needed for severe pain. (Patient not taking: Reported on 09/02/2019) 10 tablet 0   No current facility-administered medications on file prior to visit.      Objective:  Objective  Physical Exam Vitals signs and nursing note reviewed.  Constitutional:      Appearance: She is well-developed.  HENT:     Head: Normocephalic and atraumatic.  Eyes:     Conjunctiva/sclera: Conjunctivae normal.  Neck:     Musculoskeletal: Normal range of motion and neck supple.     Thyroid: No thyromegaly.     Vascular: No carotid bruit or JVD.  Cardiovascular:     Rate and Rhythm: Normal rate and regular rhythm.     Heart sounds: Normal heart sounds. No murmur.  Pulmonary:     Effort: Pulmonary effort is normal. No respiratory distress.     Breath sounds: Normal breath sounds. No wheezing or rales.  Chest:     Chest wall: No tenderness.  Neurological:     Mental Status: She is alert and oriented to person, place, and time.    BP 122/80 (BP Location: Right Arm, Patient Position: Sitting, Cuff Size: Large)   Pulse 86   Temp 97.7 F (36.5 C) (Temporal)   Resp 18   Ht 5\' 4"  (1.626 m)   Wt 248 lb 3.2 oz (112.6 kg)   SpO2 100%   BMI 42.60 kg/m  Wt Readings from Last 3 Encounters:  09/02/19 248 lb  3.2 oz (112.6 kg)  01/20/19 237 lb (107.5 kg)  01/17/19 235 lb 14.3 oz (107 kg)     Lab Results  Component Value Date   WBC 11.3 (H) 01/15/2019   HGB 11.0 (L) 01/15/2019   HCT 33.3 (L) 01/15/2019   PLT 302.0 01/15/2019   GLUCOSE 75 03/26/2018   CHOL 204 (H) 03/26/2018   TRIG 71.0 03/26/2018   HDL 58.70 03/26/2018   LDLDIRECT 140.0 03/21/2016   LDLCALC 131 (H) 03/26/2018   ALT 18 03/26/2018   AST 18 03/26/2018   NA 137 03/26/2018   K 4.1 03/26/2018   CL 103 03/26/2018   CREATININE 0.69 03/26/2018   BUN 12 03/26/2018   CO2 27 03/26/2018   TSH 0.81 03/26/2018    No results found.   Assessment & Plan:  Plan  I am having Debbie Alvarez start on metroNIDAZOLE and Lo Loestrin Fe. I am also having her maintain her hydrOXYzine, sertraline, cephALEXin, acetaminophen, doxycycline, oxyCODONE-acetaminophen, metroNIDAZOLE, and valACYclovir.  Meds ordered this encounter  Medications  . metroNIDAZOLE (METROGEL VAGINAL) 0.75 % vaginal gel    Sig: Place 1 Applicatorful vaginally at bedtime.    Dispense:  70 g    Refill:  0  . Norethindrone-Ethinyl Estradiol-Fe Biphas (LO LOESTRIN FE) 1 MG-10 MCG / 10 MCG tablet    Sig: Take 1 tablet by mouth daily.    Dispense:  3 Package    Refill:  1    Problem List Items Addressed This Visit    None    Visit Diagnoses    STD exposure    -  Primary   Relevant Orders   Urine cytology ancillary only([facility])   POCT urine pregnancy (Completed)   Bad odor of urine       Relevant Orders   POCT urinalysis dipstick (Completed)   Urine Culture   Bacterial vaginal infection       Relevant Medications   metroNIDAZOLE (METROGEL VAGINAL) 0.75 % vaginal gel   Other Relevant Orders   Ambulatory referral to Obstetrics / Gynecology   POCT urine pregnancy (Completed)   Encounter for initial prescription of contraceptive pills       Relevant Medications   Norethindrone-Ethinyl Estradiol-Fe Biphas (LO LOESTRIN FE) 1 MG-10 MCG / 10 MCG tablet    Other Relevant Orders   POCT urine pregnancy      Follow-up: Return if symptoms worsen or fail to improve.  Ann Held, DO

## 2019-09-02 NOTE — Patient Instructions (Signed)
Bacterial Vaginosis  Bacterial vaginosis is a vaginal infection that occurs when the normal balance of bacteria in the vagina is disrupted. It results from an overgrowth of certain bacteria. This is the most common vaginal infection among women ages 15-44. Because bacterial vaginosis increases your risk for STIs (sexually transmitted infections), getting treated can help reduce your risk for chlamydia, gonorrhea, herpes, and HIV (human immunodeficiency virus). Treatment is also important for preventing complications in pregnant women, because this condition can cause an early (premature) delivery. What are the causes? This condition is caused by an increase in harmful bacteria that are normally present in small amounts in the vagina. However, the reason that the condition develops is not fully understood. What increases the risk? The following factors may make you more likely to develop this condition:  Having a new sexual partner or multiple sexual partners.  Having unprotected sex.  Douching.  Having an intrauterine device (IUD).  Smoking.  Drug and alcohol abuse.  Taking certain antibiotic medicines.  Being pregnant. You cannot get bacterial vaginosis from toilet seats, bedding, swimming pools, or contact with objects around you. What are the signs or symptoms? Symptoms of this condition include:  Grey or white vaginal discharge. The discharge can also be watery or foamy.  A fish-like odor with discharge, especially after sexual intercourse or during menstruation.  Itching in and around the vagina.  Burning or pain with urination. Some women with bacterial vaginosis have no signs or symptoms. How is this diagnosed? This condition is diagnosed based on:  Your medical history.  A physical exam of the vagina.  Testing a sample of vaginal fluid under a microscope to look for a large amount of bad bacteria or abnormal cells. Your health care provider may use a cotton swab or  a small wooden spatula to collect the sample. How is this treated? This condition is treated with antibiotics. These may be given as a pill, a vaginal cream, or a medicine that is put into the vagina (suppository). If the condition comes back after treatment, a second round of antibiotics may be needed. Follow these instructions at home: Medicines  Take over-the-counter and prescription medicines only as told by your health care provider.  Take or use your antibiotic as told by your health care provider. Do not stop taking or using the antibiotic even if you start to feel better. General instructions  If you have a female sexual partner, tell her that you have a vaginal infection. She should see her health care provider and be treated if she has symptoms. If you have a female sexual partner, he does not need treatment.  During treatment: ? Avoid sexual activity until you finish treatment. ? Do not douche. ? Avoid alcohol as directed by your health care provider. ? Avoid breastfeeding as directed by your health care provider.  Drink enough water and fluids to keep your urine clear or pale yellow.  Keep the area around your vagina and rectum clean. ? Wash the area daily with warm water. ? Wipe yourself from front to back after using the toilet.  Keep all follow-up visits as told by your health care provider. This is important. How is this prevented?  Do not douche.  Wash the outside of your vagina with warm water only.  Use protection when having sex. This includes latex condoms and dental dams.  Limit how many sexual partners you have. To help prevent bacterial vaginosis, it is best to have sex with just one partner (  monogamous).  Make sure you and your sexual partner are tested for STIs.  Wear cotton or cotton-lined underwear.  Avoid wearing tight pants and pantyhose, especially during summer.  Limit the amount of alcohol that you drink.  Do not use any products that contain  nicotine or tobacco, such as cigarettes and e-cigarettes. If you need help quitting, ask your health care provider.  Do not use illegal drugs. Where to find more information  Centers for Disease Control and Prevention: www.cdc.gov/std  American Sexual Health Association (ASHA): www.ashastd.org  [place].S[place] Department of Health and Human Services, Office on Women's Health: www.womenshealth.gov/ or https://www.womenshealth.gov/a-z-topics/bacterial-vaginosis Contact a health care provider if:  Your symptoms do not improve, even after treatment.  You have more discharge or pain when urinating.  You have a fever.  You have pain in your abdomen.  You have pain during sex.  You have vaginal bleeding between periods. Summary  Bacterial vaginosis is a vaginal infection that occurs when the normal balance of bacteria in the vagina is disrupted.  Because bacterial vaginosis increases your risk for STIs (sexually transmitted infections), getting treated can help reduce your risk for chlamydia, gonorrhea, herpes, and HIV (human immunodeficiency virus). Treatment is also important for preventing complications in pregnant women, because the condition can cause an early (premature) delivery.  This condition is treated with antibiotic medicines. These may be given as a pill, a vaginal cream, or a medicine that is put into the vagina (suppository). This information is not intended to replace advice given to you by your health care provider. Make sure you discuss any questions you have with your health care provider. Document Released: 11/21/2005 Document Revised: 11/03/2017 Document Reviewed: 08/06/2016 Elsevier Patient Education  2020 Elsevier Inc.  

## 2019-09-04 ENCOUNTER — Other Ambulatory Visit: Payer: Self-pay | Admitting: Family Medicine

## 2019-09-04 DIAGNOSIS — N390 Urinary tract infection, site not specified: Secondary | ICD-10-CM

## 2019-09-04 LAB — URINE CYTOLOGY ANCILLARY ONLY
Bacterial Vaginitis (gardnerella): NEGATIVE
Candida Glabrata: POSITIVE — AB
Chlamydia: NEGATIVE
Molecular Disclaimer: NEGATIVE
Molecular Disclaimer: NEGATIVE
Molecular Disclaimer: NEGATIVE
Molecular Disclaimer: NORMAL
Neisseria Gonorrhea: NEGATIVE
Trichomonas: NEGATIVE

## 2019-09-04 LAB — URINE CULTURE
MICRO NUMBER:: 928757
SPECIMEN QUALITY:: ADEQUATE

## 2019-09-04 MED ORDER — AMOXICILLIN-POT CLAVULANATE 500-125 MG PO TABS: ORAL_TABLET | ORAL | 0 refills | Status: DC

## 2019-09-04 MED FILL — AMOX-CLAV 500-125 MG TABLET: 500-125 | 7 days supply | Qty: 14 | Fill #0

## 2019-09-05 ENCOUNTER — Other Ambulatory Visit: Payer: Self-pay | Admitting: Family Medicine

## 2019-09-05 ENCOUNTER — Other Ambulatory Visit: Payer: Self-pay

## 2019-09-05 DIAGNOSIS — B373 Candidiasis of vulva and vagina: Secondary | ICD-10-CM

## 2019-09-05 DIAGNOSIS — B3731 Acute candidiasis of vulva and vagina: Secondary | ICD-10-CM

## 2019-09-05 MED ORDER — FLUCONAZOLE 150 MG PO TABS: ORAL_TABLET | ORAL | 0 refills | Status: DC

## 2019-09-05 MED FILL — FLUCONAZOLE 150 MG TABS: 150 | 2 days supply | Qty: 2 | Fill #0

## 2019-09-06 LAB — URINE CYTOLOGY ANCILLARY ONLY: Candida vaginitis: NEGATIVE

## 2019-10-28 ENCOUNTER — Other Ambulatory Visit: Payer: Self-pay | Admitting: Medical

## 2020-01-16 ENCOUNTER — Other Ambulatory Visit: Payer: Self-pay

## 2020-01-16 ENCOUNTER — Encounter: Payer: Self-pay | Admitting: Family Medicine

## 2020-01-16 ENCOUNTER — Ambulatory Visit (INDEPENDENT_AMBULATORY_CARE_PROVIDER_SITE_OTHER): Payer: Managed Care, Other (non HMO) | Admitting: Family Medicine

## 2020-01-16 DIAGNOSIS — N761 Subacute and chronic vaginitis: Secondary | ICD-10-CM | POA: Insufficient documentation

## 2020-01-16 MED ORDER — PHILLIPS COLON HEALTH PO CAPS: 1.0000 | ORAL_CAPSULE | Freq: Every day | ORAL | 11 refills | Status: DC

## 2020-01-16 MED ORDER — REPHRESH VA GEL: VAGINAL | 2 refills | Status: DC

## 2020-01-16 NOTE — Assessment & Plan Note (Addendum)
Pt using probiotic and rephresh --- both sent to pharmacy

## 2020-01-16 NOTE — Progress Notes (Signed)
Virtual Visit via Video Note  I connected with Debbie Alvarez on 01/16/20 at  3:40 PM EST by a video enabled telemedicine application and verified that I am speaking with the correct person using two identifiers.  Location: Patient: home Provider: office    I discussed the limitations of evaluation and management by telemedicine and the availability of in person appointments. The patient expressed understanding and agreed to proceed.  History of Present Illness: Pt is home and would like probiotic and rephresh sent to the pharmacy because she can use her flex sending card to get it.  She has moved to [place].  No complaints    Observations/Objective: There were no vitals filed for this visit. Pt is in [place]   Assessment and Plan: 1. Chronic vaginitis  Pt using probiotic and rephresh --- both sent to pharmacy   Follow Up Instructions:    I discussed the assessment and treatment plan with the patient. The patient was provided an opportunity to ask questions and all were answered. The patient agreed with the plan and demonstrated an understanding of the instructions.   The patient was advised to call back or seek an in-person evaluation if the symptoms worsen or if the condition fails to improve as anticipated.  I provided 20 minutes of non-face-to-face time during this encounter.   Donato Schultz, DO

## 2020-04-16 ENCOUNTER — Other Ambulatory Visit: Payer: Self-pay | Admitting: Family Medicine

## 2020-04-16 DIAGNOSIS — B009 Herpesviral infection, unspecified: Secondary | ICD-10-CM

## 2020-05-17 ENCOUNTER — Other Ambulatory Visit: Payer: Self-pay | Admitting: Family Medicine

## 2020-05-17 DIAGNOSIS — B009 Herpesviral infection, unspecified: Secondary | ICD-10-CM

## 2020-08-20 ENCOUNTER — Emergency Department (HOSPITAL_BASED_OUTPATIENT_CLINIC_OR_DEPARTMENT_OTHER)
Admission: EM | Admit: 2020-08-20 | Discharge: 2020-08-20 | Disposition: A | Discharge status: Home / Self Care | Payer: Managed Care, Other (non HMO) | Attending: Emergency Medicine | Admitting: Emergency Medicine

## 2020-08-20 ENCOUNTER — Emergency Department (HOSPITAL_BASED_OUTPATIENT_CLINIC_OR_DEPARTMENT_OTHER): Payer: Managed Care, Other (non HMO)

## 2020-08-20 ENCOUNTER — Other Ambulatory Visit: Payer: Self-pay

## 2020-08-20 ENCOUNTER — Encounter (HOSPITAL_BASED_OUTPATIENT_CLINIC_OR_DEPARTMENT_OTHER): Payer: Self-pay | Admitting: Emergency Medicine

## 2020-08-20 DIAGNOSIS — F17290 Nicotine dependence, other tobacco product, uncomplicated: Secondary | ICD-10-CM | POA: Insufficient documentation

## 2020-08-20 DIAGNOSIS — K8050 Calculus of bile duct without cholangitis or cholecystitis without obstruction: Secondary | ICD-10-CM

## 2020-08-20 DIAGNOSIS — N939 Abnormal uterine and vaginal bleeding, unspecified: Secondary | ICD-10-CM | POA: Diagnosis not present

## 2020-08-20 DIAGNOSIS — K8051 Calculus of bile duct without cholangitis or cholecystitis with obstruction: Secondary | ICD-10-CM | POA: Insufficient documentation

## 2020-08-20 DIAGNOSIS — R1011 Right upper quadrant pain: Secondary | ICD-10-CM | POA: Diagnosis present

## 2020-08-20 LAB — PREGNANCY, URINE: Preg Test, Ur: NEGATIVE

## 2020-08-20 LAB — CBC WITH DIFFERENTIAL/PLATELET
Abs Immature Granulocytes: 0.02 10*3/uL (ref 0.00–0.07)
Basophils Absolute: 0.1 10*3/uL (ref 0.0–0.1)
Basophils Relative: 1 %
Eosinophils Absolute: 0.2 10*3/uL (ref 0.0–0.5)
Eosinophils Relative: 3 %
HCT: 36.8 % (ref 36.0–46.0)
Hemoglobin: 12.2 g/dL (ref 12.0–15.0)
Immature Granulocytes: 0 %
Lymphocytes Relative: 28 %
Lymphs Abs: 2.1 10*3/uL (ref 0.7–4.0)
MCH: 31 pg (ref 26.0–34.0)
MCHC: 33.2 g/dL (ref 30.0–36.0)
MCV: 93.4 fL (ref 80.0–100.0)
Monocytes Absolute: 0.6 10*3/uL (ref 0.1–1.0)
Monocytes Relative: 8 %
Neutro Abs: 4.5 10*3/uL (ref 1.7–7.7)
Neutrophils Relative %: 60 %
Platelets: 288 10*3/uL (ref 150–400)
RBC: 3.94 MIL/uL (ref 3.87–5.11)
RDW: 14.1 % (ref 11.5–15.5)
WBC: 7.5 10*3/uL (ref 4.0–10.5)
nRBC: 0 % (ref 0.0–0.2)

## 2020-08-20 LAB — URINALYSIS, ROUTINE W REFLEX MICROSCOPIC
Bilirubin Urine: NEGATIVE
Glucose, UA: NEGATIVE mg/dL
Ketones, ur: NEGATIVE mg/dL
Leukocytes,Ua: NEGATIVE
Nitrite: NEGATIVE
Protein, ur: NEGATIVE mg/dL
Specific Gravity, Urine: >1.03 — ABNORMAL HIGH (ref 1.005–1.030)
pH: 6 (ref 5.0–8.0)

## 2020-08-20 LAB — URINALYSIS, MICROSCOPIC (REFLEX)

## 2020-08-20 LAB — COMPREHENSIVE METABOLIC PANEL
ALT: 53 U/L — ABNORMAL HIGH (ref 0–44)
AST: 61 U/L — ABNORMAL HIGH (ref 15–41)
Albumin: 3.8 g/dL (ref 3.5–5.0)
Alkaline Phosphatase: 72 U/L (ref 38–126)
Anion gap: 12 (ref 5–15)
BUN: 10 mg/dL (ref 6–20)
CO2: 25 mmol/L (ref 22–32)
Calcium: 9.5 mg/dL (ref 8.9–10.3)
Chloride: 104 mmol/L (ref 98–111)
Creatinine, Ser: 0.78 mg/dL (ref 0.44–1.00)
GFR calc Af Amer: >60 mL/min (ref >60)
GFR calc non Af Amer: >60 mL/min (ref >60)
Glucose, Bld: 99 mg/dL (ref 70–99)
Potassium: 4.5 mmol/L (ref 3.5–5.1)
Sodium: 141 mmol/L (ref 135–145)
Total Bilirubin: 0.4 mg/dL (ref 0.3–1.2)
Total Protein: 7.9 g/dL (ref 6.5–8.1)

## 2020-08-20 LAB — LIPASE, BLOOD: Lipase: 27 U/L (ref 11–51)

## 2020-08-20 MED ORDER — FENTANYL CITRATE (PF) 100 MCG/2ML IJ SOLN
100.0000 ug | Freq: Once | INTRAMUSCULAR | Status: AC
  Administered 2020-08-20: 100 ug via INTRAVENOUS
  Filled 2020-08-20: qty 2

## 2020-08-20 MED ORDER — ONDANSETRON HCL 4 MG/2ML IJ SOLN
4.0000 mg | Freq: Once | INTRAMUSCULAR | Status: AC
  Administered 2020-08-20: 4 mg via INTRAVENOUS
  Filled 2020-08-20: qty 2

## 2020-08-20 MED ORDER — DICYCLOMINE HCL 20 MG PO TABS: 20.0000 mg | ORAL_TABLET | Freq: Three times a day (TID) | ORAL | 0 refills | Status: DC | PRN

## 2020-08-20 MED ORDER — ONDANSETRON 4 MG PO TBDP
4.0000 mg | ORAL_TABLET | Freq: Once | ORAL | Status: AC
  Administered 2020-08-20: 4 mg via ORAL
  Filled 2020-08-20: qty 1

## 2020-08-20 MED ORDER — ONDANSETRON 4 MG PO TBDP: 4.0000 mg | ORAL_TABLET | Freq: Three times a day (TID) | ORAL | 0 refills | Status: DC | PRN

## 2020-08-20 NOTE — Discharge Instructions (Signed)
Biliary Colic, Adult  Biliary colic is severe pain caused by a problem with a small organ in the upper right part of your belly (gallbladder). The gallbladder stores a digestive fluid produced in the liver (bile) that helps the body break down fat. Bile and other digestive enzymes are carried from the liver to the small intestine through tube-like structures (bile ducts). The gallbladder and the bile ducts form the biliary tract. Sometimes hard deposits of digestive fluids form in the gallbladder (gallstones) and block the flow of bile from the gallbladder, causing biliary colic. This condition is also called a gallbladder attack. Gallstones can be as small as a grain of sand or as big as a golf ball. There could be just one gallstone in the gallbladder, or there could be many. What are the causes? Biliary colic is usually caused by gallstones. Less often, a tumor could block the flow of bile from the gallbladder and trigger biliary colic. What increases the risk? This condition is more likely to develop in:  Women.  People of Hispanic descent.  People with a family history of gallstones.  People who are obese.  People who suddenly or quickly lose weight.  People who eat a high-calorie, low-fiber diet that is rich in refined carbs (carbohydrates), such as white bread and white rice.  People who have an intestinal disease that affects nutrient absorption, such as Crohn disease.  People who have a metabolic condition, such as metabolic syndrome or diabetes. What are the signs or symptoms? Severe pain in the upper right side of the belly is the main symptom of biliary colic. You may feel this pain below the chest but above the hip. This pain often occurs at night or after eating a very fatty meal. This pain may get worse for up to an hour and last as long as 12 hours. In most cases, the pain fades (subsides) within a couple hours. Other symptoms of this condition include:  Nausea and  vomiting.  Pain under the right shoulder. How is this diagnosed? This condition is diagnosed based on your medical history, your symptoms, and a physical exam. You may have tests, including:  Blood tests to rule out infection or inflammation of the bile ducts, gallbladder, pancreas, or liver.  Imaging studies such as: ? Ultrasound. ? CT scan. ? MRI. In some cases, you may need to have an imaging study done using a small amount of radioactive material (nuclear medicine) to confirm the diagnosis. How is this treated? Treatment for this condition may include medicine to relieve your pain or nausea. If you have gallstones that are causing biliary colic, you may need surgery to remove the gallbladder (cholecystectomy). Gallstones can also be dissolved gradually with medicine. It may take months or years before the gallstones are completely gone. Follow these instructions at home:  Take over-the-counter and prescription medicines only as told by your health care provider.  Drink enough fluid to keep your urine clear or pale yellow.  Follow instructions from your health care provider about eating or drinking restrictions. These may include avoiding: ? Fatty, greasy, and fried foods. ? Any foods that make the pain worse. ? Overeating. ? Having a large meal after not eating for a while.  Keep all follow-up visits as told by your health care provider. This is important. How is this prevented? Steps to prevent this condition include:  Maintaining a healthy body weight.  Getting regular exercise.  Eating a healthy, high-fiber, low-fat diet.  Limiting how much   sugar and refined carbs you eat, such as sweets, white flour, and white rice. Contact a health care provider if:  Your pain lasts more than 5 hours.  You vomit.  You have a fever and chills.  Your pain gets worse. Get help right away if:  Your skin or the whites of your eyes look yellow (jaundice).  Your have tea-colored  urine and light-colored stools.  You are dizzy or you faint. Summary  Biliary colic is severe pain caused by a problem with a small organ in the upper right part of your belly (gallbladder).  Treatments for this condition include medicines that relieves your pain or nausea and medicines that slowly dissolves the gallstones.  If gallstones cause your biliary colic, the treatment is surgery to remove the gallbladder (cholecystectomy). This information is not intended to replace advice given to you by your health care provider. Make sure you discuss any questions you have with your health care provider. Document Revised: 11/03/2017 Document Reviewed: 06/06/2016 Elsevier Patient Education  2020 Elsevier Inc.  

## 2020-08-20 NOTE — ED Triage Notes (Signed)
Patient presents with complaints of epigastic pain sudden onset after eating some soup; states 3 episodes of vomiting; denies diarrhea. States intermittent NV x 1 month with back pain and epigastic pain.

## 2020-08-20 NOTE — ED Notes (Signed)
Pt resting with eyes closed, awaiting u/s

## 2020-08-20 NOTE — ED Notes (Signed)
MD with pt

## 2020-08-20 NOTE — ED Notes (Signed)
ED Provider at bedside. 

## 2020-08-20 NOTE — ED Provider Notes (Signed)
[facility] EMERGENCY DEPARTMENT Provider Note   CSN: [id number] Arrival date & time: 08/20/20  0056     History Chief Complaint  Patient presents with  . Abdominal Pain    Debbie Alvarez is a 24 y.o. female.  The history is provided by the patient.  Abdominal Pain Pain location:  RUQ Pain quality: aching   Pain radiates to:  R shoulder Pain severity:  Moderate Onset quality:  Gradual Timing:  Intermittent Progression:  Worsening Chronicity:  New Relieved by:  Nothing Worsened by:  Movement and palpation Associated symptoms: vaginal bleeding and vomiting   Associated symptoms: no chest pain and no fever   Patient w/ history of anxiety presents with abdominal pain.  She reports she has had intermittent upper abdominal and back pain for a month.  She reports over the past day she has had worsening epigastric right upper quadrant abdominal pain that radiates to her right shoulder.  She reports that she had nausea/ vomiting.  No chest pain or shortness of breath.  No diarrhea constipation.  She does report mild vaginal bleeding for the past month, but no change in her birth control.     Past Medical History:  Diagnosis Date  . Anxiety   . Chronic neck and back pain 11/2018   due to macromastia  . Depression   . Macromastia 11/2018    Patient Active Problem List   Diagnosis Date Noted  . Chronic vaginitis 01/16/2020  . Acute non-recurrent pansinusitis 01/15/2019  . Abscess of back 01/15/2019  . High risk heterosexual behavior 03/26/2018  . Initiation of oral contraception 03/26/2018  . Preventative health care 03/26/2018  . Severe recurrent major depression without psychotic features (HCC) 03/05/2018  . Premenstrual syndrome 01/28/2015    Past Surgical History:  Procedure Laterality Date  . BREAST REDUCTION SURGERY Bilateral 12/11/2018   Procedure: BILATERAL BREAST REDUCTION;  Surgeon: Glenna Fellows, MD;  Location: [facility];   Service: Plastics;  Laterality: Bilateral;  . TONSILLECTOMY/ADENOIDECTOMY/TURBINATE REDUCTION N/A 12/01/2014   Procedure: TONSILLECTOMY/ADENOIDECTOMY/TURBINATE REDUCTION;  Surgeon: Drema Halon, MD;  Location: [facility];  Service: ENT;  Laterality: N/A;  . WISDOM TOOTH EXTRACTION       OB History   No obstetric history on file.     Family History  Problem Relation Age of Onset  . Hypertension Maternal Grandmother     Social History   Tobacco Use  . Smoking status: Current Every Day Smoker    Years: 1.00    Types: Cigars  . Smokeless tobacco: Never Used  . Tobacco comment: 1 Black & Mild/day  Vaping Use  . Vaping Use: Never used  Substance Use Topics  . Alcohol use: Yes    Comment: occasionally  . Drug use: Not Currently    Home Medications Prior to Admission medications   Medication Sig Start Date End Date Taking? Authorizing Provider  acetaminophen (TYLENOL) 500 MG tablet Take 500 mg by mouth every 6 (six) hours as needed.    [provider]  fluconazole (DIFLUCAN) 150 MG tablet 1 po x1, may repeat in 3 days prn Patient not taking: Reported on 01/16/2020 09/05/19   Donato Schultz, DO  hydrOXYzine (ATARAX/VISTARIL) 25 MG tablet Take 1 tablet (25 mg total) by mouth 3 (three) times daily as needed for anxiety. 03/08/18   Money, Gerlene Burdock, FNP  metroNIDAZOLE (FLAGYL) 500 MG tablet Take 1 tablet (500 mg total) by mouth 3 (three) times daily. Patient not taking:  Reported on 01/16/2020 01/21/19   Saguier, Ramon Dredge, PA-C  metroNIDAZOLE (METROGEL VAGINAL) 0.75 % vaginal gel Place 1 Applicatorful vaginally at bedtime. 09/02/19   Donato Schultz, DO  Norethindrone-Ethinyl Estradiol-Fe Biphas (LO LOESTRIN FE) 1 MG-10 MCG / 10 MCG tablet Take 1 tablet by mouth daily. 09/02/19   Donato Schultz, DO  oxyCODONE-acetaminophen (PERCOCET/ROXICET) 5-325 MG tablet Take 1 tablet by mouth every 4 (four) hours as needed for severe pain. 01/17/19   Bethel Born, PA-C  Probiotic Product [facility]) CAPS Take 1 capsule by mouth daily. 01/16/20   Donato Schultz, DO  sertraline (ZOLOFT) 100 MG tablet Take 100 mg by mouth daily.    [provider]  Vaginal Lubricant (REPHRESH) GEL As directed 01/16/20   Zola Button, Grayling Congress, DO  valACYclovir (VALTREX) 1000 MG tablet TAKE ONE TABLET BY MOUTH DAILY 05/18/20   Zola Button, Grayling Congress, DO    Allergies    Coconut flavor and Hydrocodone-acetaminophen  Review of Systems   Review of Systems  Constitutional: Negative for fever.  Cardiovascular: Negative for chest pain.  Gastrointestinal: Positive for abdominal pain and vomiting. Negative for blood in stool.  Genitourinary: Positive for vaginal bleeding.  All other systems reviewed and are negative.   Physical Exam Updated Vital Signs BP (!) 118/56   Pulse (!) 56   Temp 98.8 F (37.1 C) (Oral)   Resp 18   Ht 1.626 m (5\' 4" )   Wt 115 kg   LMP 08/16/2020   SpO2 98%   BMI 43.52 kg/m   Physical Exam CONSTITUTIONAL: Well developed/well nourished HEAD: Normocephalic/atraumatic EYES: EOMI/PERRL, no icterus ENMT: Mucous membranes moist NECK: supple no meningeal signs SPINE/BACK:entire spine nontender CV: S1/S2 noted, no murmurs/rubs/gallops noted LUNGS: Lungs are clear to auscultation bilaterally, no apparent distress ABDOMEN: soft, moderate RUQ tenderness, no rebound or guarding, bowel sounds noted throughout abdomen GU:no cva tenderness NEURO: Pt is awake/alert/appropriate, moves all extremitiesx4.  No facial droop.   EXTREMITIES: pulses normal/equal, full ROM SKIN: warm, color normal PSYCH: no abnormalities of mood noted, alert and oriented to situation  ED Results / Procedures / Treatments   Labs (all labs ordered are listed, but only abnormal results are displayed) Labs Reviewed  URINALYSIS, ROUTINE W REFLEX MICROSCOPIC - Abnormal; Notable for the following components:      Result Value   APPearance  HAZY (*)    Specific Gravity, Urine >1.030 (*)    Hgb urine dipstick MODERATE (*)    All other components within normal limits  URINALYSIS, MICROSCOPIC (REFLEX) - Abnormal; Notable for the following components:   Bacteria, UA MANY (*)    All other components within normal limits  COMPREHENSIVE METABOLIC PANEL - Abnormal; Notable for the following components:   AST 61 (*)    ALT 53 (*)    All other components within normal limits  PREGNANCY, URINE  CBC WITH DIFFERENTIAL/PLATELET  LIPASE, BLOOD    EKG None  Radiology No results found.  Procedures Procedures  Medications Ordered in ED Medications  ondansetron (ZOFRAN-ODT) disintegrating tablet 4 mg (4 mg Oral Given 08/20/20 0258)  fentaNYL (SUBLIMAZE) injection 100 mcg (100 mcg Intravenous Given 08/20/20 0527)  ondansetron (ZOFRAN) injection 4 mg (4 mg Intravenous Given 08/20/20 0528)    ED Course  I have reviewed the triage vital signs and the nursing notes.  Pertinent labs  results that were available during my care of the patient were reviewed by me and considered in my medical  decision making (see chart for details).    MDM Rules/Calculators/A&P                           This patient presents to the ED for concern of abdominal pain, this involves an extensive number of treatment options, and is a complaint that carries with it a high risk of complications and morbidity.  The differential diagnosis includes gastritis, cholecystitis, pancreatitis, bowel perforation, UTI   Lab Tests:   I Ordered, reviewed, and interpreted labs, which included electrolytes, complete blood count, lipase, urinalysis, urine pregnancy  Medicines ordered:   I ordered medication fentanyl for pain  Reevaluation:  After the interventions stated above, I reevaluated the patient and found patient improved after pain medicine  6:59 AM Signed out to Dr. Madilyn Hook with RUQ ultrasound pending  Final Clinical Impression(s) / ED Diagnoses Final  diagnoses:  None    Rx / DC Orders ED Discharge Orders    None       Zadie Rhine, MD 08/20/20 0700

## 2020-08-20 NOTE — ED Notes (Addendum)
CT aware that pt needs an u/s. Pt aware to remain NPO

## 2020-08-20 NOTE — ED Provider Notes (Signed)
Patient care assumed at 0700. Patient here with recurrent epigastric abdominal pain, concern for cholecystitis. Right upper quadrant ultrasound pending.  Right upper quadrant ultrasound without evidence of cholecystitis but does demonstrate a large gallstone in the gallbladder. On examination patient has no abdominal tenderness. Discussed with patient home care for biliary colic. Discussed importance of surgery follow-up as well as return precautions.  UA with bacteria present, patient is asymptomatic, presentation is not consistent with UTI.   Tilden Fossa, MD 08/20/20 1024

## 2020-09-03 ENCOUNTER — Telehealth: Payer: Self-pay | Admitting: Family Medicine

## 2020-09-03 DIAGNOSIS — Z30011 Encounter for initial prescription of contraceptive pills: Secondary | ICD-10-CM

## 2020-09-03 MED ORDER — LO LOESTRIN FE 1 MG-10 MCG / 10 MCG PO TABS: 1.0000 | ORAL_TABLET | Freq: Every day | ORAL | 0 refills | Status: DC

## 2020-09-03 NOTE — Telephone Encounter (Signed)
I have sent a 1 month supply (1 pack) to her pharmacy. Last OV 01/2020, she will need to be seen for further refills please.

## 2020-09-03 NOTE — Telephone Encounter (Signed)
Medication:  Norethindrone-Ethinyl Estradiol-Fe Biphas (LO LOESTRIN FE) 1 MG-10 MCG / 10 MCG tablet   Has the patient contacted their pharmacy? No. (If no, request that the patient contact the pharmacy for the refill.) (If yes, when and what did the pharmacy advise?)  Preferred Pharmacy (with phone number or street name):  [id number] [address], [place] Phone:  [phone]  Fax:  [phone]      Agent: Please be advised that RX refills may take up to 3 business days. We ask that you follow-up with your pharmacy.

## 2020-09-08 ENCOUNTER — Other Ambulatory Visit: Payer: Self-pay | Admitting: Surgery

## 2020-09-09 ENCOUNTER — Other Ambulatory Visit: Payer: Self-pay | Admitting: Family Medicine

## 2020-09-09 ENCOUNTER — Other Ambulatory Visit: Payer: Self-pay

## 2020-09-09 ENCOUNTER — Ambulatory Visit (HOSPITAL_COMMUNITY)
Admission: RE | Admit: 2020-09-09 | Discharge: 2020-09-09 | Disposition: A | Discharge status: Home / Self Care | Payer: 59 | Source: Home / Self Care | Attending: Psychiatry | Admitting: Psychiatry

## 2020-09-09 ENCOUNTER — Ambulatory Visit (HOSPITAL_COMMUNITY)
Admission: EM | Admit: 2020-09-09 | Discharge: 2020-09-10 | Disposition: A | Discharge status: Other Acute Inpatient Hospital | Payer: 59 | Attending: Psychiatry | Admitting: Psychiatry

## 2020-09-09 DIAGNOSIS — F32A Depression, unspecified: Secondary | ICD-10-CM | POA: Insufficient documentation

## 2020-09-09 DIAGNOSIS — G479 Sleep disorder, unspecified: Secondary | ICD-10-CM | POA: Insufficient documentation

## 2020-09-09 DIAGNOSIS — Z79899 Other long term (current) drug therapy: Secondary | ICD-10-CM | POA: Diagnosis not present

## 2020-09-09 DIAGNOSIS — R45851 Suicidal ideations: Secondary | ICD-10-CM | POA: Insufficient documentation

## 2020-09-09 DIAGNOSIS — F17210 Nicotine dependence, cigarettes, uncomplicated: Secondary | ICD-10-CM | POA: Insufficient documentation

## 2020-09-09 DIAGNOSIS — Z7289 Other problems related to lifestyle: Secondary | ICD-10-CM | POA: Diagnosis not present

## 2020-09-09 DIAGNOSIS — F1290 Cannabis use, unspecified, uncomplicated: Secondary | ICD-10-CM | POA: Diagnosis not present

## 2020-09-09 DIAGNOSIS — Z9151 Personal history of suicidal behavior: Secondary | ICD-10-CM | POA: Insufficient documentation

## 2020-09-09 DIAGNOSIS — F332 Major depressive disorder, recurrent severe without psychotic features: Secondary | ICD-10-CM

## 2020-09-09 DIAGNOSIS — R450 Nervousness: Secondary | ICD-10-CM | POA: Insufficient documentation

## 2020-09-09 DIAGNOSIS — F322 Major depressive disorder, single episode, severe without psychotic features: Secondary | ICD-10-CM | POA: Insufficient documentation

## 2020-09-09 DIAGNOSIS — B009 Herpesviral infection, unspecified: Secondary | ICD-10-CM

## 2020-09-09 DIAGNOSIS — Z20822 Contact with and (suspected) exposure to covid-19: Secondary | ICD-10-CM | POA: Diagnosis not present

## 2020-09-09 DIAGNOSIS — F419 Anxiety disorder, unspecified: Secondary | ICD-10-CM | POA: Insufficient documentation

## 2020-09-09 LAB — POC SARS CORONAVIRUS 2 AG: SARS Coronavirus 2 Ag: NEGATIVE

## 2020-09-09 LAB — POCT URINALYSIS DIP (DEVICE)
Bilirubin Urine: NEGATIVE
Glucose, UA: NEGATIVE mg/dL
Hgb urine dipstick: NEGATIVE
Ketones, ur: NEGATIVE mg/dL
Leukocytes,Ua: NEGATIVE
Nitrite: NEGATIVE
Protein, ur: 30 mg/dL — AB
Specific Gravity, Urine: >=1.03 (ref 1.005–1.030)
Urobilinogen, UA: 1 mg/dL (ref 0.0–1.0)
pH: 7 (ref 5.0–8.0)

## 2020-09-09 LAB — POCT URINE DRUG SCREEN - MANUAL ENTRY (I-SCREEN)
POC Amphetamine UR: NOT DETECTED
POC Buprenorphine (BUP): NOT DETECTED
POC Cocaine UR: NOT DETECTED
POC Marijuana UR: POSITIVE — AB
POC Methadone UR: NOT DETECTED
POC Methamphetamine UR: NOT DETECTED
POC Morphine: NOT DETECTED
POC Oxazepam (BZO): NOT DETECTED
POC Oxycodone UR: NOT DETECTED
POC Secobarbital (BAR): NOT DETECTED

## 2020-09-09 LAB — RESPIRATORY PANEL BY RT PCR (FLU A&B, COVID)
Influenza A by PCR: NEGATIVE
Influenza B by PCR: NEGATIVE
SARS Coronavirus 2 by RT PCR: NEGATIVE

## 2020-09-09 LAB — POCT PREGNANCY, URINE
Preg Test, Ur: NEGATIVE
Preg Test, Ur: NEGATIVE

## 2020-09-09 LAB — POC SARS CORONAVIRUS 2 AG -  ED: SARS Coronavirus 2 Ag: NEGATIVE

## 2020-09-09 MED ORDER — NORETHIN-ETH ESTRAD-FE BIPHAS 1 MG-10 MCG / 10 MCG PO TABS
1.0000 | ORAL_TABLET | Freq: Every day | ORAL | Status: DC
  Administered 2020-09-09: 1 via ORAL

## 2020-09-09 MED ORDER — IBUPROFEN 600 MG PO TABS: 600.0000 mg | ORAL_TABLET | Freq: Three times a day (TID) | ORAL | Status: DC | PRN

## 2020-09-09 MED ORDER — ALUM & MAG HYDROXIDE-SIMETH 200-200-20 MG/5ML PO SUSP: 30.0000 mL | ORAL | Status: DC | PRN

## 2020-09-09 MED ORDER — MAGNESIUM HYDROXIDE 400 MG/5ML PO SUSP: 30.0000 mL | Freq: Every day | ORAL | Status: DC | PRN

## 2020-09-09 MED ORDER — HYDROXYZINE HCL 25 MG PO TABS
25.0000 mg | ORAL_TABLET | Freq: Three times a day (TID) | ORAL | Status: DC | PRN
  Filled 2020-09-09: qty 1

## 2020-09-09 MED ORDER — HYDROXYZINE HCL 25 MG PO TABS
25.0000 mg | ORAL_TABLET | Freq: Three times a day (TID) | ORAL | Status: DC | PRN
  Administered 2020-09-09: 25 mg via ORAL
  Filled 2020-09-09: qty 1

## 2020-09-09 MED ORDER — SERTRALINE HCL 50 MG PO TABS
50.0000 mg | ORAL_TABLET | Freq: Every day | ORAL | Status: DC
  Administered 2020-09-09 – 2020-09-10 (×2): 50 mg via ORAL
  Filled 2020-09-09 (×2): qty 1

## 2020-09-09 MED ORDER — SERTRALINE HCL 50 MG PO TABS
50.0000 mg | ORAL_TABLET | Freq: Every day | ORAL | Status: DC
  Filled 2020-09-09 (×3): qty 1

## 2020-09-09 MED ORDER — VALACYCLOVIR HCL 500 MG PO TABS
1000.0000 mg | ORAL_TABLET | Freq: Every day | ORAL | Status: DC
  Administered 2020-09-09 – 2020-09-10 (×2): 1000 mg via ORAL
  Filled 2020-09-09 (×2): qty 2

## 2020-09-09 MED ORDER — TRAZODONE HCL 50 MG PO TABS
50.0000 mg | ORAL_TABLET | Freq: Every evening | ORAL | Status: DC | PRN
  Administered 2020-09-09: 50 mg via ORAL
  Filled 2020-09-09: qty 1
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
## 2020-09-09 NOTE — ED Notes (Signed)
Pt sleeping at present, no distress noted, monitoring for safety. 

## 2020-09-09 NOTE — ED Notes (Signed)
Pt ambulated per self on unit. Oriented to unit and staff. A&O x4. Will continue to monitor for safety

## 2020-09-09 NOTE — ED Notes (Signed)
Pt A&O x 4, sleeping at present, no distress noted, remains SI, monitoring for safety.

## 2020-09-09 NOTE — ED Notes (Addendum)
Pt alert and oriented during admission. Pt denies HI, A/VH, and any pain.Education, support, reassurance, and encouragement provided. Pt's belongings in locker. Pt denies any concerns at this time, and verbally contracts for safety. Pt ambulating on the unit with no issues. Pt remains safe on the unit.

## 2020-09-09 NOTE — ED Notes (Signed)
Pt states that she takes her birth control at 2100 and it is located in her locker

## 2020-09-09 NOTE — Telephone Encounter (Signed)
Patient is calling to check the status of medication. 

## 2020-09-09 NOTE — ED Notes (Signed)
Patient belongings in locker 31 

## 2020-09-09 NOTE — ED Provider Notes (Signed)
Behavioral Health Admission H&P [facility] & OBS)  Date: 09/09/20 Patient Name: Debbie Alvarez MRN: [id number] Chief Complaint: No chief complaint on file.     Diagnoses:  Final diagnoses:  None    HPI: 24 year old female who presented to the [facility] as a walk-in accompanied by her mother.  Patient was reporting that she is having worsening depressive symptoms and anxiety with suicidal ideations.  Patient presented today reporting that she had superficially cut her right arm.  Patient reports she did not feel safe to leave the hospital when she was at the [facility].  Patient was transported to [facility] for continuous observation.  Patient reported symptoms of depression to include worthlessness, anxiety, tearful spells, and unable to get out of bed.  Patient also reported having anxiety.  Patient agreed.  Presented to the [facility] for overnight observation.  Patient reported at 1 previous suicide attempt in 2019.  Patient also reports that she is still currently feeling very depressed with some vague suicidal ideations.  Patient is in agreement with starting Zoloft 50 mg p.o. daily and Vistaril 25 mg p.o. 3 times daily as needed.  Patient will remain at the [facility] overnight will be reassessed tomorrow.  Patient does admit to smoking marijuana but denies any other substance use.  Patient denies having any homicidal ideations and denies any hallucinations.  PHQ 2-9:     Total Time spent with patient: 30 minutes  Musculoskeletal  Strength & Muscle Tone: within normal limits Gait & Station: normal Patient leans: N/A  Psychiatric Specialty Exam  Presentation General Appearance: Appropriate for Environment;Casual;Fairly Groomed  Eye Contact:Good  Speech:Clear and Coherent;Normal Rate  Speech Volume:Normal  Handedness:Right   Mood and Affect  Mood:Anxious;Depressed  Affect:Appropriate;Congruent;Depressed   Thought Process  Thought Processes:Coherent  Descriptions of  Associations:Intact  Orientation:Full (Time, Place and Person)  Thought Content:WDL  Hallucinations:Hallucinations: None  Ideas of Reference:None  Suicidal Thoughts:Suicidal Thoughts: Yes, Passive  Homicidal Thoughts:Homicidal Thoughts: No   Sensorium  Memory:Immediate Good;Recent Good;Remote Good  Judgment:Good  Insight:Fair   Executive Functions  Concentration:Good  Attention Span:Good  Recall:Good  Fund of Knowledge:Good  Language:Good   Psychomotor Activity  Psychomotor Activity:Psychomotor Activity: Normal   Assets  Assets:Communication Skills;Desire for Improvement;Financial Resources/Insurance;Housing;Physical Health;Social Support;Transportation   Sleep  Sleep:Sleep: Good   Physical Exam Vitals and nursing note reviewed.  Constitutional:      Appearance: She is well-developed.  HENT:     Head: Normocephalic.  Eyes:     Pupils: Pupils are equal, round, and reactive to light.  Cardiovascular:     Rate and Rhythm: Normal rate.  Pulmonary:     Effort: Pulmonary effort is normal.  Musculoskeletal:        General: Normal range of motion.  Skin:    Findings: Laceration present.          Comments: Superficial lacerations  Neurological:     Mental Status: She is alert and oriented to person, place, and time.    Review of Systems  Constitutional: Negative.   HENT: Negative.   Eyes: Negative.   Respiratory: Negative.   Cardiovascular: Negative.   Gastrointestinal: Negative.   Genitourinary: Negative.   Musculoskeletal: Negative.   Skin: Negative.   Neurological: Negative.   Endo/Heme/Allergies: Negative.   Psychiatric/Behavioral: Positive for depression and suicidal ideas. The patient is nervous/anxious.     Blood pressure 118/83, pulse 67, temperature 98.4 F (36.9 C), temperature source Oral, resp. rate 18, height 5\' 4"  (1.626 m), weight 208 lb (94.3  kg), last menstrual period 08/16/2020, SpO2 100 %. Body mass index is 35.7  kg/m.  Past Psychiatric History: MDD   Is the patient at risk to self? Yes  Has the patient been a risk to self in the past 6 months? No .    Has the patient been a risk to self within the distant past? No   Is the patient a risk to others? No   Has the patient been a risk to others in the past 6 months? No   Has the patient been a risk to others within the distant past? No   Past Medical History:  Past Medical History:  Diagnosis Date  . Anxiety   . Chronic neck and back pain 11/2018   due to macromastia  . Depression   . Macromastia 11/2018    Past Surgical History:  Procedure Laterality Date  . BREAST REDUCTION SURGERY Bilateral 12/11/2018   Procedure: BILATERAL BREAST REDUCTION;  Surgeon: Glenna Fellows, MD;  Location: [facility];  Service: Plastics;  Laterality: Bilateral;  . TONSILLECTOMY/ADENOIDECTOMY/TURBINATE REDUCTION N/A 12/01/2014   Procedure: TONSILLECTOMY/ADENOIDECTOMY/TURBINATE REDUCTION;  Surgeon: Drema Halon, MD;  Location: [facility];  Service: ENT;  Laterality: N/A;  . WISDOM TOOTH EXTRACTION      Family History:  Family History  Problem Relation Age of Onset  . Hypertension Maternal Grandmother     Social History:  Social History   Socioeconomic History  . Marital status: Single    Spouse name: Not on file  . Number of children: Not on file  . Years of education: Not on file  . Highest education level: Not on file  Occupational History    Comment: student  Tobacco Use  . Smoking status: Current Every Day Smoker    Years: 1.00    Types: Cigars  . Smokeless tobacco: Never Used  . Tobacco comment: 1 Black & Mild/day  Vaping Use  . Vaping Use: Never used  Substance and Sexual Activity  . Alcohol use: Yes    Comment: occasionally  . Drug use: Not Currently  . Sexual activity: Not Currently    Birth control/protection: Abstinence  Other Topics Concern  . Not on file  Social History Narrative  . Not  on file   Social Determinants of Health   Financial Resource Strain:   . Difficulty of Paying Living Expenses: Not on file  Food Insecurity:   . Worried About Programme researcher, broadcasting/film/video in the Last Year: Not on file  . Ran Out of Food in the Last Year: Not on file  Transportation Needs:   . Lack of Transportation (Medical): Not on file  . Lack of Transportation (Non-Medical): Not on file  Physical Activity:   . Days of Exercise per Week: Not on file  . Minutes of Exercise per Session: Not on file  Stress:   . Feeling of Stress : Not on file  Social Connections:   . Frequency of Communication with Friends and Family: Not on file  . Frequency of Social Gatherings with Friends and Family: Not on file  . Attends Religious Services: Not on file  . Active Member of Clubs or Organizations: Not on file  . Attends Banker Meetings: Not on file  . Marital Status: Not on file  Intimate Partner Violence:   . Fear of Current or Ex-Partner: Not on file  . Emotionally Abused: Not on file  . Physically Abused: Not on file  . Sexually Abused: Not  on file    SDOH:  SDOH Screenings   Alcohol Screen:   . Last Alcohol Screening Score (AUDIT): Not on file  Depression (PHQ2-9):   . PHQ-2 Score: Not on file  Financial Resource Strain:   . Difficulty of Paying Living Expenses: Not on file  Food Insecurity:   . Worried About Programme researcher, broadcasting/film/videounning Out of Food in the Last Year: Not on file  . Ran Out of Food in the Last Year: Not on file  Housing:   . Last Housing Risk Score: Not on file  Physical Activity:   . Days of Exercise per Week: Not on file  . Minutes of Exercise per Session: Not on file  Social Connections:   . Frequency of Communication with Friends and Family: Not on file  . Frequency of Social Gatherings with Friends and Family: Not on file  . Attends Religious Services: Not on file  . Active Member of Clubs or Organizations: Not on file  . Attends BankerClub or Organization Meetings: Not on  file  . Marital Status: Not on file  Stress:   . Feeling of Stress : Not on file  Tobacco Use: High Risk  . Smoking Tobacco Use: Current Every Day Smoker  . Smokeless Tobacco Use: Never Used  Transportation Needs:   . Freight forwarderLack of Transportation (Medical): Not on file  . Lack of Transportation (Non-Medical): Not on file    Last Labs:  Admission on 08/20/2020, Discharged on 08/20/2020  Component Date Value Ref Range Status  . Preg Test, Ur 08/20/2020 NEGATIVE  NEGATIVE Final   Comment:        THE SENSITIVITY OF THIS METHODOLOGY IS >20 mIU/mL. Performed at [facility] [place], [address] [place], [place] [address]   . Color, Urine 08/20/2020 YELLOW  YELLOW Final  . APPearance 08/20/2020 HAZY* CLEAR Final  . Specific Gravity, Urine 08/20/2020 >1.030* 1.005 - 1.030 Final  . pH 08/20/2020 6.0  5.0 - 8.0 Final  . Glucose, UA 08/20/2020 NEGATIVE  NEGATIVE mg/dL Final  . Hgb urine dipstick 08/20/2020 MODERATE* NEGATIVE Final  . Bilirubin Urine 08/20/2020 NEGATIVE  NEGATIVE Final  . Ketones, ur 08/20/2020 NEGATIVE  NEGATIVE mg/dL Final  . Protein, ur 60/45/409809/16/2021 NEGATIVE  NEGATIVE mg/dL Final  . Nitrite 11/91/478209/16/2021 NEGATIVE  NEGATIVE Final  . Glori LuisLeukocytes,Ua 08/20/2020 NEGATIVE  NEGATIVE Final   Performed at [facility] [place], [address] [place], [place] [address]  . RBC / HPF 08/20/2020 0-5  0 - 5 RBC/hpf Final  . WBC, UA 08/20/2020 0-5  0 - 5 WBC/hpf Final  . Bacteria, UA 08/20/2020 MANY* NONE SEEN Final  . Squamous Epithelial / LPF 08/20/2020 0-5  0 - 5 Final  . Mucus 08/20/2020 PRESENT   Final   Performed at [facility] [place], [address] [place], [place] [address]  . WBC 08/20/2020 7.5  4.0 - 10.5 K/uL Final  . RBC 08/20/2020 3.94  3.87 - 5.11 MIL/uL Final  . Hemoglobin 08/20/2020 12.2  12.0 - 15.0 g/dL Final  . HCT 78/46/962909/16/2021 36.8  36 - 46 % Final  . MCV 08/20/2020 93.4  80.0 - 100.0 fL Final  . MCH 08/20/2020 31.0  26.0 - 34.0 pg Final  . MCHC  08/20/2020 33.2  30.0 - 36.0 g/dL Final  . RDW 52/84/132409/16/2021 14.1  11.5 - 15.5 % Final  . Platelets 08/20/2020 288  150 - 400 K/uL Final  . nRBC 08/20/2020 0.0  0.0 - 0.2 % Final  .  Neutrophils Relative % 08/20/2020 60  % Final  . Neutro Abs 08/20/2020 4.5  1.7 - 7.7 K/uL Final  . Lymphocytes Relative 08/20/2020 28  % Final  . Lymphs Abs 08/20/2020 2.1  0.7 - 4.0 K/uL Final  . Monocytes Relative 08/20/2020 8  % Final  . Monocytes Absolute 08/20/2020 0.6  0 - 1 K/uL Final  . Eosinophils Relative 08/20/2020 3  % Final  . Eosinophils Absolute 08/20/2020 0.2  0 - 0 K/uL Final  . Basophils Relative 08/20/2020 1  % Final  . Basophils Absolute 08/20/2020 0.1  0 - 0 K/uL Final  . Immature Granulocytes 08/20/2020 0  % Final  . Abs Immature Granulocytes 08/20/2020 0.02  0.00 - 0.07 K/uL Final   Performed at [facility], [address] [place], [place] [address]  . Sodium 08/20/2020 141  135 - 145 mmol/L Final  . Potassium 08/20/2020 4.5  3.5 - 5.1 mmol/L Final  . Chloride 08/20/2020 104  98 - 111 mmol/L Final  . CO2 08/20/2020 25  22 - 32 mmol/L Final  . Glucose, Bld 08/20/2020 99  70 - 99 mg/dL Final   Glucose reference range applies only to samples taken after fasting for at least 8 hours.  . BUN 08/20/2020 10  6 - 20 mg/dL Final  . Creatinine, Ser 08/20/2020 0.78  0.44 - 1.00 mg/dL Final  . Calcium 73/42/8768 9.5  8.9 - 10.3 mg/dL Final  . Total Protein 08/20/2020 7.9  6.5 - 8.1 g/dL Final  . Albumin 11/57/2620 3.8  3.5 - 5.0 g/dL Final  . AST 35/59/7416 61* 15 - 41 U/L Final  . ALT 08/20/2020 53* 0 - 44 U/L Final  . Alkaline Phosphatase 08/20/2020 72  38 - 126 U/L Final  . Total Bilirubin 08/20/2020 0.4  0.3 - 1.2 mg/dL Final  . GFR calc non Af Amer 08/20/2020 >60  >60 mL/min Final  . GFR calc Af Amer 08/20/2020 >60  >60 mL/min Final  . Anion gap 08/20/2020 12  5 - 15 Final   Performed at [facility], [address] [place], [place] [address]  . Lipase 08/20/2020  27  11 - 51 U/L Final   Performed at [facility], [address] [place], [place] [address]    Allergies: Coconut flavor and Hydrocodone-acetaminophen  PTA Medications: (Not in a hospital admission)   Medical Decision Making  Labs and covid ordered Admit to continuous assessment Start Zoloft 50 mg Daily Start Vistrail 25 mg PO TID PRN Restart birth control medication and Valtrex    Recommendations  Based on my evaluation the patient does not appear to have an emergency medical condition.  Gerlene Burdock Kymiah Araiza, FNP 09/09/20  5:20 PM
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
## 2020-09-10 ENCOUNTER — Other Ambulatory Visit: Payer: Self-pay

## 2020-09-10 ENCOUNTER — Encounter (HOSPITAL_COMMUNITY): Payer: Self-pay | Admitting: Psychiatry

## 2020-09-10 ENCOUNTER — Inpatient Hospital Stay (HOSPITAL_COMMUNITY)
Admission: AD | Admit: 2020-09-10 | Discharge: 2020-09-13 | DRG: 885 | Disposition: A | Discharge status: Home / Self Care | Payer: 59 | Source: Intra-hospital | Attending: Psychiatry | Admitting: Psychiatry

## 2020-09-10 DIAGNOSIS — K5900 Constipation, unspecified: Secondary | ICD-10-CM | POA: Diagnosis present

## 2020-09-10 DIAGNOSIS — Z9152 Personal history of nonsuicidal self-harm: Secondary | ICD-10-CM | POA: Diagnosis not present

## 2020-09-10 DIAGNOSIS — E669 Obesity, unspecified: Secondary | ICD-10-CM | POA: Diagnosis present

## 2020-09-10 DIAGNOSIS — N761 Subacute and chronic vaginitis: Secondary | ICD-10-CM | POA: Diagnosis present

## 2020-09-10 DIAGNOSIS — R45851 Suicidal ideations: Secondary | ICD-10-CM | POA: Diagnosis present

## 2020-09-10 DIAGNOSIS — J0140 Acute pansinusitis, unspecified: Secondary | ICD-10-CM | POA: Diagnosis present

## 2020-09-10 DIAGNOSIS — Z7251 High risk heterosexual behavior: Secondary | ICD-10-CM | POA: Diagnosis not present

## 2020-09-10 DIAGNOSIS — F1220 Cannabis dependence, uncomplicated: Secondary | ICD-10-CM | POA: Diagnosis present

## 2020-09-10 DIAGNOSIS — Z20822 Contact with and (suspected) exposure to covid-19: Secondary | ICD-10-CM | POA: Diagnosis present

## 2020-09-10 DIAGNOSIS — F17210 Nicotine dependence, cigarettes, uncomplicated: Secondary | ICD-10-CM | POA: Diagnosis present

## 2020-09-10 DIAGNOSIS — Z7289 Other problems related to lifestyle: Secondary | ICD-10-CM

## 2020-09-10 DIAGNOSIS — T7422XA Child sexual abuse, confirmed, initial encounter: Secondary | ICD-10-CM

## 2020-09-10 DIAGNOSIS — Z62810 Personal history of physical and sexual abuse in childhood: Secondary | ICD-10-CM | POA: Diagnosis present

## 2020-09-10 DIAGNOSIS — F411 Generalized anxiety disorder: Secondary | ICD-10-CM | POA: Diagnosis present

## 2020-09-10 DIAGNOSIS — F332 Major depressive disorder, recurrent severe without psychotic features: Secondary | ICD-10-CM | POA: Diagnosis present

## 2020-09-10 DIAGNOSIS — Z6835 Body mass index (BMI) 35.0-35.9, adult: Secondary | ICD-10-CM | POA: Diagnosis not present

## 2020-09-10 DIAGNOSIS — G4700 Insomnia, unspecified: Secondary | ICD-10-CM | POA: Diagnosis present

## 2020-09-10 LAB — TSH: TSH: 1.394 u[IU]/mL (ref 0.350–4.500)

## 2020-09-10 LAB — HEMOGLOBIN A1C
Hgb A1c MFr Bld: 5.4 % (ref 4.8–5.6)
Mean Plasma Glucose: 108.28 mg/dL

## 2020-09-10 LAB — COMPREHENSIVE METABOLIC PANEL
ALT: 23 U/L (ref 0–44)
AST: 14 U/L — ABNORMAL LOW (ref 15–41)
Albumin: 3.4 g/dL — ABNORMAL LOW (ref 3.5–5.0)
Alkaline Phosphatase: 60 U/L (ref 38–126)
Anion gap: 10 (ref 5–15)
BUN: 14 mg/dL (ref 6–20)
CO2: 20 mmol/L — ABNORMAL LOW (ref 22–32)
Calcium: 9.1 mg/dL (ref 8.9–10.3)
Chloride: 109 mmol/L (ref 98–111)
Creatinine, Ser: 0.83 mg/dL (ref 0.44–1.00)
GFR calc non Af Amer: >60 mL/min (ref >60)
Glucose, Bld: 91 mg/dL (ref 70–99)
Potassium: 4.2 mmol/L (ref 3.5–5.1)
Sodium: 139 mmol/L (ref 135–145)
Total Bilirubin: 0.4 mg/dL (ref 0.3–1.2)
Total Protein: 6.6 g/dL (ref 6.5–8.1)

## 2020-09-10 LAB — CBC WITH DIFFERENTIAL/PLATELET
Abs Immature Granulocytes: 0.01 10*3/uL (ref 0.00–0.07)
Basophils Absolute: 0 10*3/uL (ref 0.0–0.1)
Basophils Relative: 1 %
Eosinophils Absolute: 0.2 10*3/uL (ref 0.0–0.5)
Eosinophils Relative: 3 %
HCT: 35.3 % — ABNORMAL LOW (ref 36.0–46.0)
Hemoglobin: 11.5 g/dL — ABNORMAL LOW (ref 12.0–15.0)
Immature Granulocytes: 0 %
Lymphocytes Relative: 48 %
Lymphs Abs: 2.9 10*3/uL (ref 0.7–4.0)
MCH: 30.7 pg (ref 26.0–34.0)
MCHC: 32.6 g/dL (ref 30.0–36.0)
MCV: 94.1 fL (ref 80.0–100.0)
Monocytes Absolute: 0.5 10*3/uL (ref 0.1–1.0)
Monocytes Relative: 9 %
Neutro Abs: 2.3 10*3/uL (ref 1.7–7.7)
Neutrophils Relative %: 39 %
Platelets: 259 10*3/uL (ref 150–400)
RBC: 3.75 MIL/uL — ABNORMAL LOW (ref 3.87–5.11)
RDW: 14.3 % (ref 11.5–15.5)
WBC: 6 10*3/uL (ref 4.0–10.5)
nRBC: 0 % (ref 0.0–0.2)

## 2020-09-10 LAB — ETHANOL: Alcohol, Ethyl (B): <10 mg/dL (ref <10)

## 2020-09-10 MED ORDER — TRAZODONE HCL 50 MG PO TABS
50.0000 mg | ORAL_TABLET | Freq: Every evening | ORAL | Status: AC | PRN
Start: 2020-09-10 — End: ?

## 2020-09-10 MED ORDER — SERTRALINE HCL 50 MG PO TABS
50.0000 mg | ORAL_TABLET | Freq: Every day | ORAL | Status: DC
  Administered 2020-09-11 – 2020-09-13 (×3): 50 mg via ORAL
  Filled 2020-09-10 (×5): qty 1

## 2020-09-10 MED ORDER — SERTRALINE HCL 50 MG PO TABS
50.0000 mg | ORAL_TABLET | Freq: Every day | ORAL | Status: AC
Start: 2020-09-11 — End: ?

## 2020-09-10 MED ORDER — TRAZODONE HCL 50 MG PO TABS
50.0000 mg | ORAL_TABLET | Freq: Every evening | ORAL | Status: DC | PRN
  Administered 2020-09-10 – 2020-09-12 (×3): 50 mg via ORAL
  Filled 2020-09-10 (×3): qty 1

## 2020-09-10 MED ORDER — ALUM & MAG HYDROXIDE-SIMETH 200-200-20 MG/5ML PO SUSP: 30.0000 mL | ORAL | Status: DC | PRN

## 2020-09-10 MED ORDER — HYDROXYZINE HCL 25 MG PO TABS
25.0000 mg | ORAL_TABLET | Freq: Three times a day (TID) | ORAL | Status: DC | PRN
  Administered 2020-09-10 – 2020-09-12 (×3): 25 mg via ORAL
  Filled 2020-09-10 (×3): qty 1

## 2020-09-10 MED ORDER — MAGNESIUM HYDROXIDE 400 MG/5ML PO SUSP: 30.0000 mL | Freq: Every day | ORAL | Status: DC | PRN

## 2020-09-10 MED ORDER — ACETAMINOPHEN 325 MG PO TABS
650.0000 mg | ORAL_TABLET | Freq: Four times a day (QID) | ORAL | Status: DC | PRN
  Administered 2020-09-11 – 2020-09-13 (×4): 650 mg via ORAL
  Filled 2020-09-10 (×4): qty 2

## 2020-09-10 NOTE — Progress Notes (Signed)
Safe Transport arrived  and he was transported to [facility] at 1748 hrs.

## 2020-09-10 NOTE — Progress Notes (Signed)
The patient learned today that she needs to use her "resources" to her advantage and work on developing a sense of "self". Her goal for tomorrow is to be more "transparent" in that she wants to be more honest with people rather than making things up.

## 2020-09-10 NOTE — BH Assessment (Signed)
Per Percell Boston , [facility], patient has been accepted to [facility], bed 303-1; Attending provider is Dr. Landry Mellow, MD.    Patient can arrive at 3:00pm. Number for report is [phone].    Reola Calkins, NP notified.    Baldo Daub, MSW, LCSW Clinical Social Worker [facility]
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
## 2020-09-10 NOTE — ED Provider Notes (Signed)
FBC/OBS ASAP Discharge Summary  Date and Time: 09/10/2020 11:02 AM  Name: Debbie Alvarez  MRN:  [id number]   Discharge Diagnoses:  Final diagnoses:  MDD (major depressive disorder), recurrent severe, without psychosis (HCC)    Subjective: Patient reports today that she continues to feel very depressed and having suicidal ideations.  Patient reports that she feels very similar to the way she was when she attempted suicide in the past and was admitted to the hospital.  Patient ports that she is safe to discharge home because she is still fearful of harming herself.  She states that she knows that she needs to be on her medications and is thankful that they were restarted but continues to feel as though an inpatient admission is needed.  Stay Summary: Patient is a 24 year old female who presented to [facility] as a walk-in voluntarily accompanied by her mother.  Patient presented endorsing suicidal ideations with worsening depressive symptoms and anxiety and self-harm behavior.  Patient had reported a previous suicide attempt in 2019 and was hospitalized.  Patient has denied any homicidal ideations and denied any hallucinations.  Patient was transferred to the [facility] for continuous observation.  Patient was restarted on Vistaril and Zoloft.  Today patient has continued to endorse suicidal ideations and severe depressive symptoms.  Patient has been accepted to [facility] for psychiatric inpatient treatment.  Total Time spent with patient: 20 minutes  Past Psychiatric History: Anxiety, depression, previous suicide attempt and hospitalization Past Medical History:  Past Medical History:  Diagnosis Date  . Anxiety   . Chronic neck and back pain 11/2018   due to macromastia  . Depression   . Macromastia 11/2018    Past Surgical History:  Procedure Laterality Date  . BREAST REDUCTION SURGERY Bilateral 12/11/2018   Procedure: BILATERAL BREAST REDUCTION;  Surgeon: Glenna Fellows, MD;  Location: [facility];  Service: Plastics;  Laterality: Bilateral;  . TONSILLECTOMY/ADENOIDECTOMY/TURBINATE REDUCTION N/A 12/01/2014   Procedure: TONSILLECTOMY/ADENOIDECTOMY/TURBINATE REDUCTION;  Surgeon: Drema Halon, MD;  Location: [facility];  Service: ENT;  Laterality: N/A;  . WISDOM TOOTH EXTRACTION     Family History:  Family History  Problem Relation Age of Onset  . Hypertension Maternal Grandmother    Family Psychiatric History: None reported Social History:  Social History   Substance and Sexual Activity  Alcohol Use Yes   Comment: occasionally     Social History   Substance and Sexual Activity  Drug Use Not Currently    Social History   Socioeconomic History  . Marital status: Single    Spouse name: Not on file  . Number of children: Not on file  . Years of education: Not on file  . Highest education level: Not on file  Occupational History    Comment: student  Tobacco Use  . Smoking status: Current Every Day Smoker    Years: 1.00    Types: Cigars  . Smokeless tobacco: Never Used  . Tobacco comment: 1 Black & Mild/day  Vaping Use  . Vaping Use: Never used  Substance and Sexual Activity  . Alcohol use: Yes    Comment: occasionally  . Drug use: Not Currently  . Sexual activity: Not Currently    Birth control/protection: Abstinence  Other Topics Concern  . Not on file  Social History Narrative  . Not on file   Social Determinants of Health   Financial Resource Strain:   . Difficulty of Paying Living Expenses: Not on file  Food Insecurity:   . Worried About Programme researcher, broadcasting/film/video in the Last Year: Not on file  . Ran Out of Food in the Last Year: Not on file  Transportation Needs:   . Lack of Transportation (Medical): Not on file  . Lack of Transportation (Non-Medical): Not on file  Physical Activity:   . Days of Exercise per Week: Not on file  . Minutes of Exercise per Session: Not on file  Stress:   . Feeling of Stress : Not  on file  Social Connections:   . Frequency of Communication with Friends and Family: Not on file  . Frequency of Social Gatherings with Friends and Family: Not on file  . Attends Religious Services: Not on file  . Active Member of Clubs or Organizations: Not on file  . Attends Banker Meetings: Not on file  . Marital Status: Not on file   SDOH:  SDOH Screenings   Alcohol Screen:   . Last Alcohol Screening Score (AUDIT): Not on file  Depression (PHQ2-9):   . PHQ-2 Score: Not on file  Financial Resource Strain:   . Difficulty of Paying Living Expenses: Not on file  Food Insecurity:   . Worried About Programme researcher, broadcasting/film/video in the Last Year: Not on file  . Ran Out of Food in the Last Year: Not on file  Housing:   . Last Housing Risk Score: Not on file  Physical Activity:   . Days of Exercise per Week: Not on file  . Minutes of Exercise per Session: Not on file  Social Connections:   . Frequency of Communication with Friends and Family: Not on file  . Frequency of Social Gatherings with Friends and Family: Not on file  . Attends Religious Services: Not on file  . Active Member of Clubs or Organizations: Not on file  . Attends Banker Meetings: Not on file  . Marital Status: Not on file  Stress:   . Feeling of Stress : Not on file  Tobacco Use: High Risk  . Smoking Tobacco Use: Current Every Day Smoker  . Smokeless Tobacco Use: Never Used  Transportation Needs:   . Freight forwarder (Medical): Not on file  . Lack of Transportation (Non-Medical): Not on file    Has this patient used any form of tobacco in the last 30 days? (Cigarettes, Smokeless Tobacco, Cigars, and/or Pipes) Prescription not provided because: doesn't smoke  Current Medications:  Current Facility-Administered Medications  Medication Dose Route Frequency Provider Last Rate Last Admin  . alum & mag hydroxide-simeth (MAALOX/MYLANTA) 200-200-20 MG/5ML suspension 30 mL  30 mL Oral Q4H  PRN Kayleeann Huxford, Gerlene Burdock, FNP      . hydrOXYzine (ATARAX/VISTARIL) tablet 25 mg  25 mg Oral TID PRN Jerusalen Mateja, Gerlene Burdock, FNP   25 mg at 09/09/20 2135  . ibuprofen (ADVIL) tablet 600 mg  600 mg Oral Q8H PRN Aundrey Elahi, Feliz Beam B, FNP      . magnesium hydroxide (MILK OF MAGNESIA) suspension 30 mL  30 mL Oral Daily PRN Brycelyn Gambino, Gerlene Burdock, FNP      . Norethindrone-Ethinyl Estradiol-Fe Biphas (LO LOESTRIN FE) 1 MG-10 MCG / 10 MCG tablet 1 tablet  1 tablet Oral Daily Allaina Brotzman, Gerlene Burdock, FNP   1 tablet at 09/09/20 2138  . sertraline (ZOLOFT) tablet 50 mg  50 mg Oral Daily Moreen Piggott B, FNP   50 mg at 09/10/20 1020  . traZODone (DESYREL) tablet 50 mg  50 mg Oral QHS PRN  Mirza Fessel, Gerlene Burdock, FNP   50 mg at 09/09/20 2135  . valACYclovir (VALTREX) tablet 1,000 mg  1,000 mg Oral Daily Willman Cuny, Gerlene Burdock, FNP   1,000 mg at 09/10/20 1019   Current Outpatient Medications  Medication Sig Dispense Refill  . Norethindrone-Ethinyl Estradiol-Fe Biphas (LO LOESTRIN FE) 1 MG-10 MCG / 10 MCG tablet Take 1 tablet by mouth daily. 28 tablet 0  . valACYclovir (VALTREX) 1000 MG tablet TAKE ONE TABLET BY MOUTH DAILY (Patient taking differently: Take 500 mg by mouth daily. ) 30 tablet 2  . dicyclomine (BENTYL) 20 MG tablet Take 1 tablet (20 mg total) by mouth 3 (three) times daily as needed for spasms. (Patient not taking: Reported on 09/10/2020) 20 tablet 0  . fluconazole (DIFLUCAN) 150 MG tablet 1 po x1, may repeat in 3 days prn (Patient not taking: Reported on 01/16/2020) 2 tablet 0  . hydrOXYzine (ATARAX/VISTARIL) 25 MG tablet Take 1 tablet (25 mg total) by mouth 3 (three) times daily as needed for anxiety. (Patient not taking: Reported on 09/10/2020) 30 tablet 0  . metroNIDAZOLE (FLAGYL) 500 MG tablet Take 1 tablet (500 mg total) by mouth 3 (three) times daily. (Patient not taking: Reported on 01/16/2020) 21 tablet 0  . metroNIDAZOLE (METROGEL VAGINAL) 0.75 % vaginal gel Place 1 Applicatorful vaginally at bedtime. (Patient not taking: Reported on  09/10/2020) 70 g 0  . ondansetron (ZOFRAN ODT) 4 MG disintegrating tablet Take 1 tablet (4 mg total) by mouth every 8 (eight) hours as needed for nausea or vomiting. (Patient not taking: Reported on 09/10/2020) 20 tablet 0  . oxyCODONE-acetaminophen (PERCOCET/ROXICET) 5-325 MG tablet Take 1 tablet by mouth every 4 (four) hours as needed for severe pain. (Patient not taking: Reported on 09/10/2020) 10 tablet 0  . Probiotic Product (PHILLIPS COLON HEALTH) CAPS Take 1 capsule by mouth daily. (Patient not taking: Reported on 09/10/2020) 30 capsule 11  . Vaginal Lubricant (REPHRESH) GEL As directed (Patient not taking: Reported on 09/10/2020) 2 g 2    PTA Medications: (Not in a hospital admission)   Musculoskeletal  Strength & Muscle Tone: within normal limits Gait & Station: normal Patient leans: N/A  Psychiatric Specialty Exam  Presentation  General Appearance: Appropriate for Environment;Casual  Eye Contact:Good  Speech:Clear and Coherent;Normal Rate  Speech Volume:Normal  Handedness:Right   Mood and Affect  Mood:Depressed  Affect:Congruent;Depressed   Thought Process  Thought Processes:Coherent  Descriptions of Associations:Intact  Orientation:Full (Time, Place and Person)  Thought Content:WDL  Hallucinations:Hallucinations: None  Ideas of Reference:None  Suicidal Thoughts:Suicidal Thoughts: Yes, Passive  Homicidal Thoughts:Homicidal Thoughts: No   Sensorium  Memory:Immediate Good;Recent Good;Remote Good  Judgment:Good  Insight:Fair   Executive Functions  Concentration:Good  Attention Span:Good  Recall:Good  Fund of Knowledge:Good  Language:Good   Psychomotor Activity  Psychomotor Activity:Psychomotor Activity: Normal   Assets  Assets:Communication Skills;Desire for Improvement;Financial Resources/Insurance;Housing;Physical Health;Social Support;Transportation;Vocational/Educational   Sleep  Sleep:Sleep: Good   Physical Exam  Physical  Exam Vitals and nursing note reviewed.  Constitutional:      Appearance: She is well-developed.  HENT:     Head: Normocephalic.  Eyes:     Pupils: Pupils are equal, round, and reactive to light.  Cardiovascular:     Rate and Rhythm: Normal rate.  Pulmonary:     Effort: Pulmonary effort is normal.  Musculoskeletal:        General: Normal range of motion.  Neurological:     Mental Status: She is alert and oriented to person, place, and time.  Review of Systems  Constitutional: Negative.   HENT: Negative.   Eyes: Negative.   Respiratory: Negative.   Cardiovascular: Negative.   Gastrointestinal: Negative.   Genitourinary: Negative.   Musculoskeletal: Negative.   Skin: Negative.   Neurological: Negative.   Endo/Heme/Allergies: Negative.   Psychiatric/Behavioral: Positive for depression and suicidal ideas. The patient is nervous/anxious.    Blood pressure 122/81, pulse 61, temperature 98.9 F (37.2 C), temperature source Oral, resp. rate 18, height 5\' 4"  (1.626 m), weight 208 lb (94.3 kg), last menstrual period 08/16/2020, SpO2 100 %. Body mass index is 35.7 kg/m.   [facility]  Yoselin Amerman B Caci Orren, FNP 09/10/2020, 11:02 AM

## 2020-09-10 NOTE — Plan of Care (Signed)
  Problem: Safety: Goal: Ability to disclose and discuss suicidal ideas will improve Outcome: Progressing   

## 2020-09-10 NOTE — Discharge Instructions (Addendum)
Discharge to [facility] 

## 2020-09-10 NOTE — Tx Team (Signed)
Initial Treatment Plan 09/10/2020 7:17 PM ISIS COSTANZA AQT:[id number]    PATIENT STRESSORS: Educational concerns Health problems Marital or family conflict Medication change or noncompliance Substance abuse   PATIENT STRENGTHS: Ability for insight Active sense of humor Average or above average intelligence Communication skills Motivation for treatment/growth Physical Health Supportive family/friends Work skills   PATIENT IDENTIFIED PROBLEMS:                      DISCHARGE CRITERIA:  Ability to meet basic life and health needs Improved stabilization in mood, thinking, and/or behavior Motivation to continue treatment in a less acute level of care Need for constant or close observation no longer present  PRELIMINARY DISCHARGE PLAN: Attend PHP/IOP Outpatient therapy Return to previous living arrangement Return to previous work or school arrangements  PATIENT/FAMILY INVOLVEMENT: This treatment plan has been presented to and reviewed with the patient, GEANNA DIVIRGILIO.  The patient and family have been given the opportunity to ask questions and make suggestions.  Raylene Miyamoto, RN 09/10/2020, 7:17 PM

## 2020-09-10 NOTE — ED Notes (Signed)
Pt sleeping at present, no distress noted, monitoring for safety. 

## 2020-09-11 DIAGNOSIS — N761 Subacute and chronic vaginitis: Secondary | ICD-10-CM

## 2020-09-11 DIAGNOSIS — F1220 Cannabis dependence, uncomplicated: Secondary | ICD-10-CM | POA: Diagnosis present

## 2020-09-11 DIAGNOSIS — T7422XA Child sexual abuse, confirmed, initial encounter: Secondary | ICD-10-CM | POA: Diagnosis present

## 2020-09-11 DIAGNOSIS — F411 Generalized anxiety disorder: Secondary | ICD-10-CM

## 2020-09-11 DIAGNOSIS — Z7289 Other problems related to lifestyle: Secondary | ICD-10-CM

## 2020-09-11 MED ORDER — THIAMINE HCL 100 MG PO TABS
100.0000 mg | ORAL_TABLET | Freq: Every day | ORAL | Status: DC
  Administered 2020-09-11 – 2020-09-13 (×3): 100 mg via ORAL
  Filled 2020-09-11 (×5): qty 1

## 2020-09-11 MED ORDER — NORETHIN-ETH ESTRAD-FE BIPHAS 1 MG-10 MCG / 10 MCG PO TABS
1.0000 | ORAL_TABLET | Freq: Every day | ORAL | Status: DC
  Administered 2020-09-11 – 2020-09-12 (×2): 1 via ORAL

## 2020-09-11 MED ORDER — FOLIC ACID 1 MG PO TABS
1.0000 mg | ORAL_TABLET | Freq: Every day | ORAL | Status: DC
  Administered 2020-09-11 – 2020-09-13 (×3): 1 mg via ORAL
  Filled 2020-09-11 (×5): qty 1

## 2020-09-11 MED ORDER — VALACYCLOVIR HCL 500 MG PO TABS
1000.0000 mg | ORAL_TABLET | Freq: Every day | ORAL | Status: DC
  Administered 2020-09-11 – 2020-09-13 (×3): 1000 mg via ORAL
  Filled 2020-09-11 (×5): qty 2

## 2020-09-11 MED ORDER — FERROUS SULFATE 325 (65 FE) MG PO TABS
325.0000 mg | ORAL_TABLET | Freq: Every day | ORAL | Status: DC
  Administered 2020-09-11 – 2020-09-13 (×3): 325 mg via ORAL
  Filled 2020-09-11 (×5): qty 1

## 2020-09-11 MED ORDER — LORAZEPAM 1 MG PO TABS: 1.0000 mg | ORAL_TABLET | Freq: Four times a day (QID) | ORAL | Status: DC | PRN
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
## 2020-09-11 NOTE — Progress Notes (Signed)
Recreation Therapy Notes  Date:  1.22.20 Time: 0930 Location: [address] Dayroom  Group Topic: Stress Management  Goal Area(s) Addresses:  Patient will identify positive stress management techniques. Patient will identify benefits of using stress management post d/c.  Intervention: Stress Management  Activity : Meditation.  LRT played a meditation that focused on appreciating the little things.  Patients were to listen and follow along as meditation played to engage in activity.  Education:  Stress Management, Discharge Planning.   Education Outcome: Acknowledges Education  Clinical Observations/Feedback: Pt did not attend group session.     Taurus Alamo, LRT/CTRS         Andy Moye A 09/11/2020 10:11 AM 
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
## 2020-09-11 NOTE — H&P (Signed)
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 expected to improve the patient's condition.    Honor Junes, MD 10/8/20214:16 PM

## 2020-09-11 NOTE — Progress Notes (Signed)
Patient pleasant and cooperative, socializing with peers. Denies Si, Hi, AVH. Visible in milieu socializing with peers. No complaints voiced. Prn given for sleep with good results. Encouragement and support provided. Safety checks maintained. Medications given as prescribed. Pt remains safe on unit with q 15 min checks.

## 2020-09-11 NOTE — Progress Notes (Addendum)
   09/10/20 1833  Vital Signs  Temp 98.6 F (37 C)  Temp Source Oral  Pulse Rate 65  Pulse Rate Source Monitor  Resp 18  BP 115/73  BP Location Left Arm  BP Method Automatic  Patient Position (if appropriate) Sitting  Oxygen Therapy  SpO2 100 %  Pain Assessment  Pain Scale 0-10  Pain Score 0  Height and Weight  Height 5\' 4"  (1.626 m)  Weight 95 kg  Type of Scale Used Standing  Type of Weight Actual  BSA (Calculated - sq m) 2.07 sq meters  BMI (Calculated) 35.93  Weight in (lb) to have BMI = 25 145.3   D:  Patient admits to some SI without a plan. Pt. Denies HI. Pt. Rates anxiety 6/10 and depression 7/10. Pt. In dayroom socializing with peers. A:  Patient took scheduled medicine.  Support and encouragement provided Routine safety checks conducted every 15 minutes. Patient  Informed to notify staff with any concerns.   R: Pt. Contracts for safety Safety maintained.

## 2020-09-11 NOTE — Tx Team (Signed)
Interdisciplinary Treatment and Diagnostic Plan Update  09/11/2020 Time of Session: 9:30am  Debbie Alvarez MRN: [id number]  Principal Diagnosis: <principal problem not specified>  Secondary Diagnoses: Active Problems:   MDD (major depressive disorder), recurrent episode, severe (HCC)   Current Medications:  Current Facility-Administered Medications  Medication Dose Route Frequency Provider Last Rate Last Admin  . acetaminophen (TYLENOL) tablet 650 mg  650 mg Oral Q6H PRN Money, Lowry Ram, FNP      . alum & mag hydroxide-simeth (MAALOX/MYLANTA) 200-200-20 MG/5ML suspension 30 mL  30 mL Oral Q4H PRN Money, Lowry Ram, FNP      . ferrous sulfate tablet 325 mg  325 mg Oral Q breakfast Dagar, Meredith Staggers, MD      . folic acid (FOLVITE) tablet 1 mg  1 mg Oral Daily Sharma Covert, MD      . hydrOXYzine (ATARAX/VISTARIL) tablet 25 mg  25 mg Oral TID PRN Money, Lowry Ram, FNP   25 mg at 09/10/20 2111  . LORazepam (ATIVAN) tablet 1 mg  1 mg Oral Q6H PRN Sharma Covert, MD      . magnesium hydroxide (MILK OF MAGNESIA) suspension 30 mL  30 mL Oral Daily PRN Money, Lowry Ram, FNP      . sertraline (ZOLOFT) tablet 50 mg  50 mg Oral Daily Money, Lowry Ram, FNP   50 mg at 09/11/20 0819  . thiamine tablet 100 mg  100 mg Oral Daily Sharma Covert, MD      . traZODone (DESYREL) tablet 50 mg  50 mg Oral QHS PRN Money, Lowry Ram, FNP   50 mg at 09/10/20 2111  . valACYclovir (VALTREX) tablet 1,000 mg  1,000 mg Oral Daily Dagar, Meredith Staggers, MD       PTA Medications: Medications Prior to Admission  Medication Sig Dispense Refill Last Dose  . hydrOXYzine (ATARAX/VISTARIL) 25 MG tablet Take 1 tablet (25 mg total) by mouth 3 (three) times daily as needed for anxiety. (Patient not taking: Reported on 09/10/2020) 30 tablet 0   . Norethindrone-Ethinyl Estradiol-Fe Biphas (LO LOESTRIN FE) 1 MG-10 MCG / 10 MCG tablet Take 1 tablet by mouth daily. 28 tablet 0   . sertraline (ZOLOFT) 50 MG tablet Take 1 tablet (50 mg  total) by mouth daily.     . traZODone (DESYREL) 50 MG tablet Take 1 tablet (50 mg total) by mouth at bedtime as needed for sleep.     . valACYclovir (VALTREX) 1000 MG tablet TAKE ONE TABLET BY MOUTH DAILY (Patient taking differently: Take 500 mg by mouth daily. ) 30 tablet 2     Patient Stressors: Educational concerns Health problems Marital or family conflict Medication change or noncompliance Substance abuse  Patient Strengths: Ability for insight Active sense of humor Average or above average intelligence Communication skills Motivation for treatment/growth Physical Health Supportive family/friends Work skills  Treatment Modalities: Medication Management, Group therapy, Case management,  1 to 1 session with clinician, Psychoeducation, Recreational therapy.   Physician Treatment Plan for Primary Diagnosis: <principal problem not specified> Long Term Goal(s):     Short Term Goals:    Medication Management: Evaluate patient's response, side effects, and tolerance of medication regimen.  Therapeutic Interventions: 1 to 1 sessions, Unit Group sessions and Medication administration.  Evaluation of Outcomes: Not Met  Physician Treatment Plan for Secondary Diagnosis: Active Problems:   MDD (major depressive disorder), recurrent episode, severe ([place])  Long Term Goal(s):     Short Term Goals:  Medication Management: Evaluate patient's response, side effects, and tolerance of medication regimen.  Therapeutic Interventions: 1 to 1 sessions, Unit Group sessions and Medication administration.  Evaluation of Outcomes: Not Met   RN Treatment Plan for Primary Diagnosis: <principal problem not specified> Long Term Goal(s): Knowledge of disease and therapeutic regimen to maintain health will improve  Short Term Goals: Ability to remain free from injury will improve, Ability to participate in decision making will improve, Ability to disclose and discuss suicidal ideas and  Ability to identify and develop effective coping behaviors will improve  Medication Management: RN will administer medications as ordered by provider, will assess and evaluate patient's response and provide education to patient for prescribed medication. RN will report any adverse and/or side effects to prescribing provider.  Therapeutic Interventions: 1 on 1 counseling sessions, Psychoeducation, Medication administration, Evaluate responses to treatment, Monitor vital signs and CBGs as ordered, Perform/monitor CIWA, COWS, AIMS and Fall Risk screenings as ordered, Perform wound care treatments as ordered.  Evaluation of Outcomes: Not Met   LCSW Treatment Plan for Primary Diagnosis: <principal problem not specified> Long Term Goal(s): Safe transition to appropriate next level of care at discharge, Engage patient in therapeutic group addressing interpersonal concerns.  Short Term Goals: Engage patient in aftercare planning with referrals and resources, Increase social support, Increase emotional regulation, Facilitate acceptance of mental health diagnosis and concerns, Identify triggers associated with mental health/substance abuse issues and Increase skills for wellness and recovery  Therapeutic Interventions: Assess for all discharge needs, 1 to 1 time with Social worker, Explore available resources and support systems, Assess for adequacy in community support network, Educate family and significant other(s) on suicide prevention, Complete Psychosocial Assessment, Interpersonal group therapy.  Evaluation of Outcomes: Not Met   Progress in Treatment: Attending groups: No. Participating in groups: No. Taking medication as prescribed: Yes. Toleration medication: Yes. Family/Significant other contact made: No, will contact:  If consents are given  Patient understands diagnosis: No. Discussing patient identified problems/goals with staff: Yes. Medical problems stabilized or resolved:  Yes. Denies suicidal/homicidal ideation: Yes. Issues/concerns per patient self-inventory: No.   New problem(s) identified: No, Describe:  None  New Short Term/Long Term Goal(s): medication stabilization, elimination of SI thoughts, development of comprehensive mental wellness plan.   Patient Goals:  Did not attend   Discharge Plan or Barriers: Patient recently admitted. CSW will continue to follow and assess for appropriate referrals and possible discharge planning.   Reason for Continuation of Hospitalization: Depression Medication stabilization Suicidal ideation  Estimated Length of Stay: 3 to 5 days   Attendees: Patient: Did not attend 09/11/2020   Physician: Ernie Hew, MD 09/11/2020   Nursing:  09/11/2020   RN Care Manager: 09/11/2020   Social Worker: Verdis Frederickson, LCSW 09/11/2020   Recreational Therapist:  09/11/2020   Other:  09/11/2020   Other:  09/11/2020   Other: 09/11/2020     Scribe for Treatment Team: Darleen Crocker, [place] 09/11/2020 10:43 AM

## 2020-09-12 DIAGNOSIS — F332 Major depressive disorder, recurrent severe without psychotic features: Principal | ICD-10-CM

## 2020-09-12 LAB — LIPID PANEL
Cholesterol: 191 mg/dL (ref 0–200)
HDL: 44 mg/dL (ref >40)
LDL Cholesterol: 133 mg/dL — ABNORMAL HIGH (ref 0–99)
Total CHOL/HDL Ratio: 4.3 RATIO
Triglycerides: 71 mg/dL (ref <150)
VLDL: 14 mg/dL (ref 0–40)

## 2020-09-12 NOTE — Progress Notes (Addendum)
   09/12/20 1300  Psych Admission Type (Psych Patients Only)  Admission Status Voluntary  Psychosocial Assessment  Patient Complaints Anxiety;Depression  Eye Contact Fair  Facial Expression Anxious  Affect Appropriate to circumstance  Speech Logical/coherent  Interaction Assertive  Motor Activity Fidgety  Appearance/Hygiene Unremarkable  Behavior Characteristics Cooperative;Appropriate to situation  Mood Anxious;Pleasant  Thought Process  Coherency WDL  Content WDL  Delusions None reported or observed  Perception WDL  Hallucination None reported or observed  Judgment Poor  Confusion None  Danger to Self  Current suicidal ideation? Denies  Self-Injurious Behavior No self-injurious ideation or behavior indicators observed or expressed   Agreement Not to Harm Self Yes  Description of Agreement verbally contracts for safety  Danger to Others  Danger to Others None reported or observed    D:Pt presents as friendly, smiles during interactions- observed in the milieu interacting well with peers and attending group. Per pts self inventory, pt rated her depression, hopelessness and anxiety a 5/3/5, respectively. Pt currently denies SI/ HI and A/VH. A: labs and vital monitored. Pt encouraged to voice concerns and ask questions. R. Pt remains safe with 15 min checks.

## 2020-09-12 NOTE — [facility] Group Notes (Signed)
.  Psychoeducational Group Note    Date:09/12/2020 Time: 1300-1400    Life Skills:  A group where two lists are made. What people need and what are things that we do that are healthy. The lists are developed by the patients and it is explained that we often do the actions that are not healthy to get our list of needs met.   Purpose of Group: . The group focus' on teaching patients on how to identify their needs and how to develop the coping skills needed to get their needs met  Participation Level:  Active  Participation Quality:  Appropriate  Affect:  Appropriate  Cognitive:  Oriented  Insight:  Improving  Engagement in Group:  Engaged  Additional Comments:  Pt was able to participate and showed understanding of the material  Debbie Alvarez

## 2020-09-12 NOTE — Progress Notes (Signed)
   09/12/20 2342  Psych Admission Type (Psych Patients Only)  Admission Status Voluntary  Psychosocial Assessment  Patient Complaints None  Eye Contact Fair  Facial Expression Other (Comment) (pleasant)  Affect Appropriate to circumstance  Speech Logical/coherent  Interaction Assertive  Motor Activity Other (Comment) (WDL)  Appearance/Hygiene Unremarkable  Behavior Characteristics Appropriate to situation  Mood Pleasant  Thought Process  Coherency WDL  Content WDL  Delusions None reported or observed  Perception WDL  Hallucination None reported or observed  Judgment WDL  Confusion None  Danger to Self  Current suicidal ideation? Denies  Self-Injurious Behavior No self-injurious ideation or behavior indicators observed or expressed   Agreement Not to Harm Self Yes  Description of Agreement verbally contracts for safety  Danger to Others  Danger to Others None reported or observed

## 2020-09-12 NOTE — Progress Notes (Signed)
   09/12/20 2326  COVID-19 Daily Checkoff  Have you had a fever (temp > 37.80C/100F)  in the past 24 hours?  No  If you have had runny nose, nasal congestion, sneezing in the past 24 hours, has it worsened? No  COVID-19 EXPOSURE  Have you traveled outside the state in the past 14 days? No  Have you been in contact with someone with a confirmed diagnosis of COVID-19 or PUI in the past 14 days without wearing appropriate PPE? No  Have you been living in the same home as a person with confirmed diagnosis of COVID-19 or a PUI (household contact)? No  Have you been diagnosed with COVID-19? No

## 2020-09-12 NOTE — Progress Notes (Signed)
Adult Psychoeducational Group Note  Date:  09/12/2020 Time:  9:05 PM  Group Topic/Focus:  Wrap-Up Group:   The focus of this group is to help patients review their daily goal of treatment and discuss progress on daily workbooks.  Participation Level:  Active  Participation Quality:  Appropriate  Affect:  Appropriate  Cognitive:  Appropriate  Insight: Appropriate  Engagement in Group:  Engaged  Modes of Intervention:  Discussion  Additional Comments:  Patient attended wrap up group and said that her day was a 7 out of 10. Her coping skills were attending groups, doing word searches, and coloring.   Mima Cranmore W Michol Emory 09/12/2020, 9:05 PM

## 2020-09-12 NOTE — Progress Notes (Signed)
   09/11/20 2133  COVID-19 Daily Checkoff  Have you had a fever (temp > 37.80C/100F)  in the past 24 hours?  No  COVID-19 EXPOSURE  Have you traveled outside the state in the past 14 days? No  Have you been in contact with someone with a confirmed diagnosis of COVID-19 or PUI in the past 14 days without wearing appropriate PPE? No  Have you been living in the same home as a person with confirmed diagnosis of COVID-19 or a PUI (household contact)? No  Have you been diagnosed with COVID-19? No

## 2020-09-12 NOTE — [facility] Group Notes (Signed)
.  Psychoeducational Group Note    Date: 09-12-20 Time: 0900-1000    Goal Setting   Purpose of Group: This group helps to provide patients with the steps of setting a goal that is specific, measurable, attainable, realistic and time specific. A discussion on how we keep ourselves stuck with negative self talk.    Participation Level:  Active  Participation Quality:  Appropriate  Affect:  Appropriate  Cognitive:  Appropriate  Insight:  Improving  Engagement in Group:  Engaged  Additional Comments:  Pt participated fully in the group. Rates her energy level at a 5/10. Goal is to write 30 positives about herself  Dione Housekeeper

## 2020-09-12 NOTE — Progress Notes (Signed)
Pt said that she has been working on her communication skills and allowing herself to take a break. She is also working on forgiving and achieving a peace of mind. Pt said that group has been helpful along with interacting with the other patients. Pt denies SI/HI and AVH. Active listening, reassurance, and support provided. Q 15 min safety checks continue. Pt's safety has been maintained.   09/11/20 2133  Psych Admission Type (Psych Patients Only)  Admission Status Voluntary  Psychosocial Assessment  Patient Complaints Anxiety;Depression;Sadness  Eye Contact Fair  Facial Expression Anxious  Affect Appropriate to circumstance  Speech Logical/coherent  Interaction Assertive  Motor Activity Fidgety  Appearance/Hygiene Unremarkable  Behavior Characteristics Cooperative;Appropriate to situation;Anxious;Calm  Mood Depressed;Anxious;Sad;Pleasant  Thought Process  Coherency WDL  Content WDL  Delusions None reported or observed  Perception WDL  Hallucination None reported or observed  Judgment Poor  Confusion None  Danger to Self  Current suicidal ideation? Denies  Self-Injurious Behavior No self-injurious ideation or behavior indicators observed or expressed   Agreement Not to Harm Self Yes  Description of Agreement verbally contracts for safety  Danger to Others  Danger to Others None reported or observed
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
## 2020-09-12 NOTE — Progress Notes (Signed)
[place] NOVEL CORONAVIRUS (COVID-19) DAILY CHECK-OFF SYMPTOMS - answer yes or no to each - every day NO YES  Have you had a fever in the past 24 hours?  . Fever (Temp > 37.80C / 100F) X   Have you had any of these symptoms in the past 24 hours? . New Cough .  Sore Throat  .  Shortness of Breath .  Difficulty Breathing .  Unexplained Body Aches   X   Have you had any one of these symptoms in the past 24 hours not related to allergies?   . Runny Nose .  Nasal Congestion .  Sneezing   X   If you have had runny nose, nasal congestion, sneezing in the past 24 hours, has it worsened?  X   EXPOSURES - check yes or no X   Have you traveled outside the state in the past 14 days?  X   Have you been in contact with someone with a confirmed diagnosis of COVID-19 or PUI in the past 14 days without wearing appropriate PPE?  X   Have you been living in the same home as a person with confirmed diagnosis of COVID-19 or a PUI (household contact)?    X   Have you been diagnosed with COVID-19?    X              What to do next: Answered NO to all: Answered YES to anything:   Proceed with unit schedule Follow the [facility] Inpatient Flowsheet.   

## 2020-09-13 MED ORDER — FERROUS SULFATE 325 (65 FE) MG PO TABS
325.0000 mg | ORAL_TABLET | Freq: Every day | ORAL | 0 refills | Status: AC
Start: 2020-09-14 — End: ?

## 2020-09-13 NOTE — Progress Notes (Signed)
[name] NOVEL CORONAVIRUS (COVID-19) DAILY CHECK-OFF SYMPTOMS - answer yes or no to each - every day NO YES  Have you had a fever in the past 24 hours?  . Fever (Temp > 37.80C / 100F) X   Have you had any of these symptoms in the past 24 hours? . New Cough .  Sore Throat  .  Shortness of Breath .  Difficulty Breathing .  Unexplained Body Aches   X   Have you had any one of these symptoms in the past 24 hours not related to allergies?   . Runny Nose .  Nasal Congestion .  Sneezing   X   If you have had runny nose, nasal congestion, sneezing in the past 24 hours, has it worsened?  X   EXPOSURES - check yes or no X   Have you traveled outside the state in the past 14 days?  X   Have you been in contact with someone with a confirmed diagnosis of COVID-19 or PUI in the past 14 days without wearing appropriate PPE?  X   Have you been living in the same home as a person with confirmed diagnosis of COVID-19 or a PUI (household contact)?    X   Have you been diagnosed with COVID-19?    X              What to do next: Answered NO to all: Answered YES to anything:   Proceed with unit schedule Follow the [facility] Inpatient Flowsheet.   

## 2020-09-13 NOTE — Progress Notes (Addendum)
   09/13/20 1100  Psych Admission Type (Psych Patients Only)  Admission Status Voluntary  Psychosocial Assessment  Patient Complaints None  Eye Contact Fair  Facial Expression Other (Comment) (pleasant)  Affect Appropriate to circumstance  Speech Logical/coherent  Interaction Assertive  Motor Activity Other (Comment) (WDL)  Appearance/Hygiene Unremarkable  Behavior Characteristics Cooperative;Appropriate to situation  Mood Pleasant  Thought Process  Coherency WDL  Content WDL  Delusions None reported or observed  Perception WDL  Hallucination None reported or observed  Judgment WDL  Confusion None  Danger to Self  Current suicidal ideation? Denies  Self-Injurious Behavior No self-injurious ideation or behavior indicators observed or expressed   Agreement Not to Harm Self Yes  Description of Agreement verbally contracts for safety  Danger to Others  Danger to Others None reported or observed
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
## 2020-09-13 NOTE — Progress Notes (Signed)
  [facility] Adult Case Management Discharge Plan :  Will you be returning to the same living situation after discharge:  Yes,  home with father. At discharge, do you have transportation home?: Yes,  father to provide. Do you have the ability to pay for your medications: Yes,  Pt is insured.   Release of information consent forms completed and in the chart;  Patient's signature needed at discharge.  Patient to Follow up at:  Follow-up Information    [facility].   Why: A referral has been made on your behalf. Please call to follow-up on your referral.  Contact information: [address] [place] [address] [phone]               Next level of care provider has access to [facility] Link:no  Safety Planning and Suicide Prevention discussed: Yes,  completed with Pt and father on 09/13/2020.     Has patient been referred to the Quitline?: N/A patient is not a smoker  Patient has been referred for addiction treatment: N/A  Jacinta Shoe, LCSW 09/13/2020, 11:09 AM

## 2020-09-13 NOTE — Progress Notes (Signed)
Pt discharged to lobby. Pt was stable and appreciative at that time. All papers and prescriptions were given and valuables returned. Verbal understanding expressed. Denies SI/HI and A/VH. Pt given opportunity to express concerns and ask questions.  

## 2020-09-13 NOTE — [facility] Group Notes (Signed)
Adult Psychoeducational Group Not Date:  09/13/2020 Time:  0900-1045 Group Topic/Focus: PROGRESSIVE RELAXATION. A group where deep breathing is taught and tensing and relaxation muscle groups is used. Imagery is used as well.  Pts are asked to imagine 3 pillars that hold them up when they are not able to hold themselves up.  Participation Level:  Active  Participation Quality:  Appropriate  Affect:  Appropriate  Cognitive:  Oriented  Insight: Improving  Engagement in Group:  Engaged  Modes of Intervention:  Activity, Discussion, Education, and Support  Additional Comments:  Rates her energy at an 8.   Dione Housekeeper 09/13/2020

## 2020-09-13 NOTE — [facility] Suicide Risk Assessment (Signed)
[facility] INPATIENT:  Family/Significant Other Suicide Prevention Education  Suicide Prevention Education:  Education Completed; Joscelyne Renville [phone],  has been identified by the patient as the family member/significant other with whom the patient will be residing, and identified as the person(s) who will aid the patient in the event of a mental health crisis (suicidal ideations/suicide attempt).  With written consent from the patient, the family member/significant other has been provided the following suicide prevention education, prior to the and/or following the discharge of the patient.  The suicide prevention education provided includes the following:  Suicide risk factors  Suicide prevention and interventions  National Suicide Hotline telephone number  [facility] assessment telephone number  [facility] Emergency Assistance 911  [facility] and/or Residential Mobile Crisis Unit telephone number  Request made of family/significant other to:  Remove weapons (e.g., guns, rifles, knives), all items previously/currently identified as safety concern.    Remove drugs/medications (over-the-counter, prescriptions, illicit drugs), all items previously/currently identified as a safety concern.  CSW completed SPE with Pt's father, Xin Klawitter. SPE information was discussed with reference to information outline in SPI pamphlet. Father was given the opportunity to ask questions and express any concerns related to Pt and/or SPE. Father confirmed that Pt does not have access to guns/weapons. Pt was provided with a copy of the SPI pamphlet. Both Pt and father verbalize understanding of the importance of maintaining safety at discharge.The family member/significant other verbalizes understanding of the suicide prevention education information provided. The family member/significant other agrees to remove the items of safety concern listed above.  Joelyn Oms Tonica Brasington,  LCSW 09/13/2020, 2:55 PM

## 2020-09-24 ENCOUNTER — Other Ambulatory Visit: Payer: Self-pay | Admitting: Family Medicine

## 2020-09-24 DIAGNOSIS — Z30011 Encounter for initial prescription of contraceptive pills: Secondary | ICD-10-CM

## 2020-12-15 ENCOUNTER — Telehealth: Payer: Self-pay | Admitting: Family Medicine

## 2020-12-15 DIAGNOSIS — Z30011 Encounter for initial prescription of contraceptive pills: Secondary | ICD-10-CM

## 2020-12-15 MED ORDER — LO LOESTRIN FE 1 MG-10 MCG / 10 MCG PO TABS: 1.0000 | ORAL_TABLET | Freq: Every day | ORAL | 0 refills | Status: AC

## 2020-12-15 NOTE — Telephone Encounter (Signed)
Medication:  LO LOESTRIN FE 1 MG-10 MCG / 10 MCG tablet [[id number]]      Has the patient contacted their pharmacy?  (If no, request that the patient contact the pharmacy for the refill.) (If yes, when and what did the pharmacy advise?)     Preferred Pharmacy (with phone number or street name):  [id number] [place], [place] - [address]  [address] [place] [address]  Phone:  [phone] Fax:  [phone]    Agent: Please be advised that RX refills may take up to 3 business days. We ask that you follow-up with your pharmacy.

## 2020-12-15 NOTE — Telephone Encounter (Signed)
28 day supply (1 package) sent. She is overdue for appt. Please schedule at her earliest convenience.

## 2020-12-16 NOTE — Telephone Encounter (Signed)
Patient was made aware. She is going to call in later to schedule

## 2021-02-12 ENCOUNTER — Telehealth: Payer: Self-pay | Admitting: Family Medicine

## 2021-02-12 DIAGNOSIS — B009 Herpesviral infection, unspecified: Secondary | ICD-10-CM

## 2021-02-12 MED ORDER — VALACYCLOVIR HCL 1 G PO TABS: 1000.0000 mg | ORAL_TABLET | Freq: Every day | ORAL | 2 refills | Status: AC

## 2021-02-12 NOTE — Telephone Encounter (Signed)
New pharmacy  Medication: valACYclovir (VALTREX) 1000 MG tablet [[id number]]       Has the patient contacted their pharmacy?  (If no, request that the patient contact the pharmacy for the refill.) (If yes, when and what did the pharmacy advise?)     Preferred Pharmacy (with phone number or street name):  [facility] [address], [place] - [address] [place] [address] Phone:  [phone]  Fax:  [phone]        Agent: Please be advised that RX refills may take up to 3 business days. We ask that you follow-up with your pharmacy.

## 2021-02-12 NOTE — Telephone Encounter (Signed)
Refill sent.

## 2021-04-27 IMAGING — US US ABDOMEN LIMITED
1 series · 14 of 25 positions shown · non-contrast
Comparison: None

CLINICAL DATA: RIGHT upper quadrant pain

EXAM:
ULTRASOUND ABDOMEN LIMITED RIGHT UPPER QUADRANT

[Series 1: us abdomen limited · 14 of 28 slices shown]
[im 1/28]
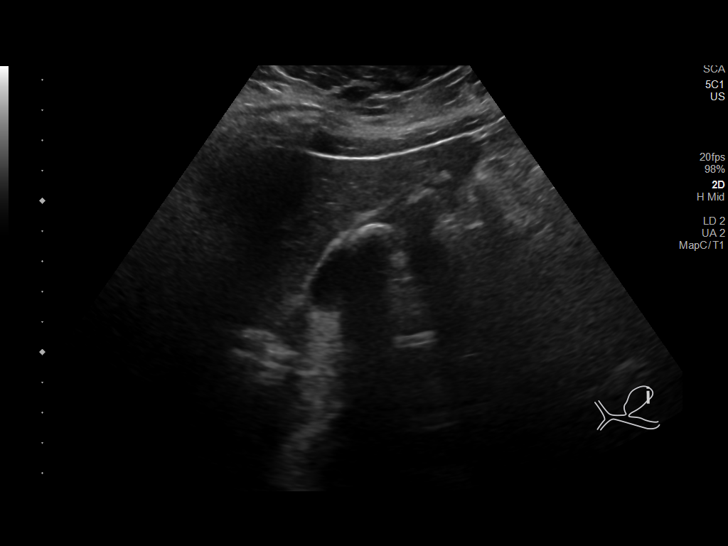
[im 3/28]
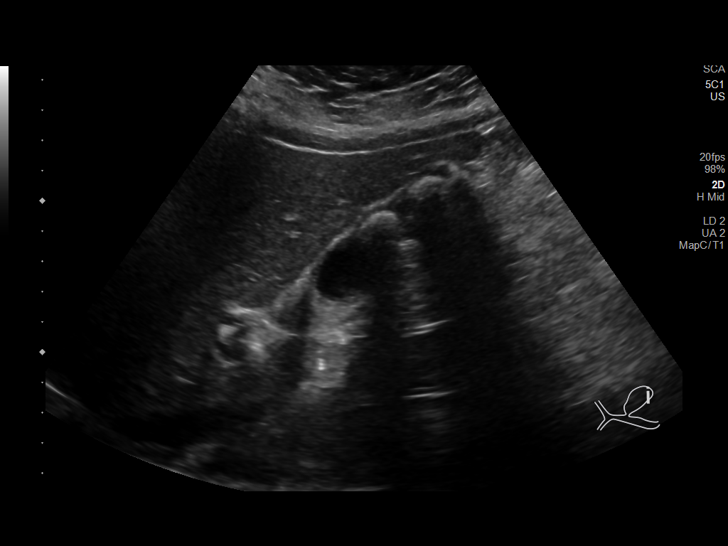
[im 5/28]
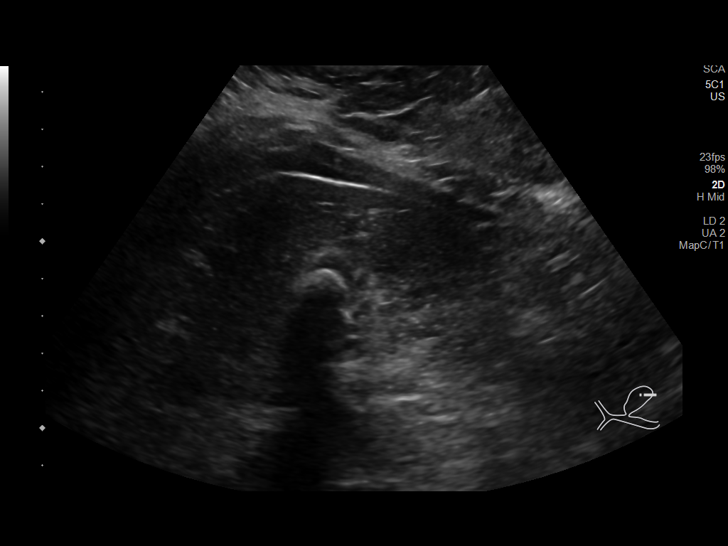
[im 7/28]
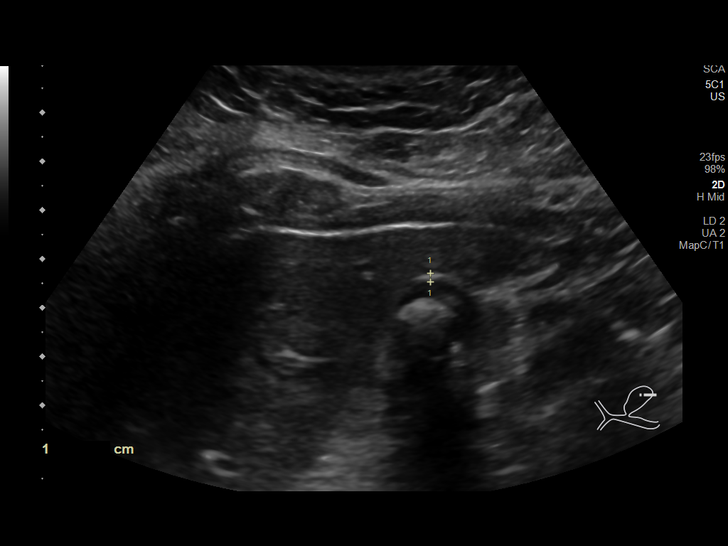
[im 10/28]
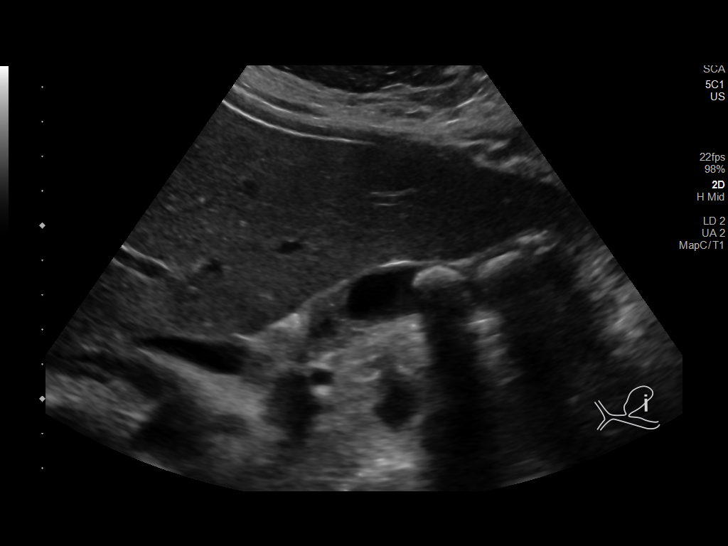
[im 11/28]
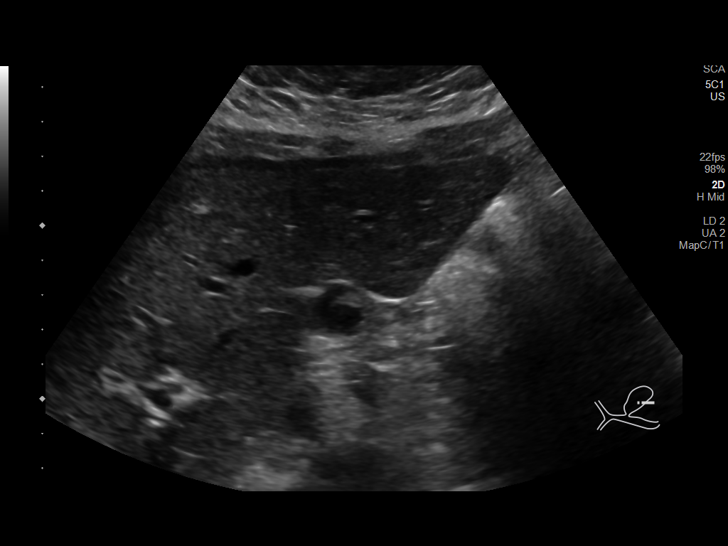
[im 13/28]
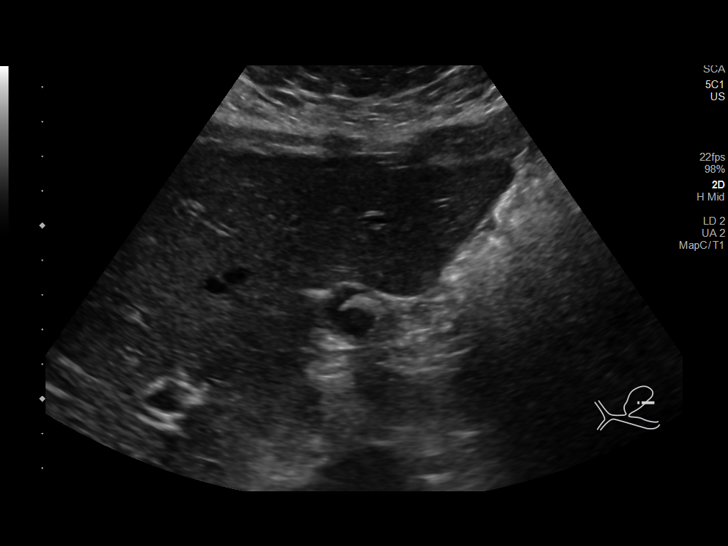
[im 15/28]
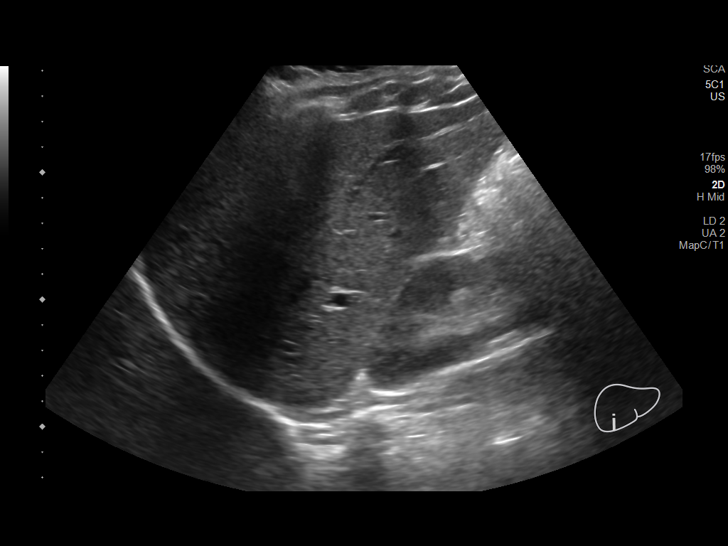
[im 17/28]
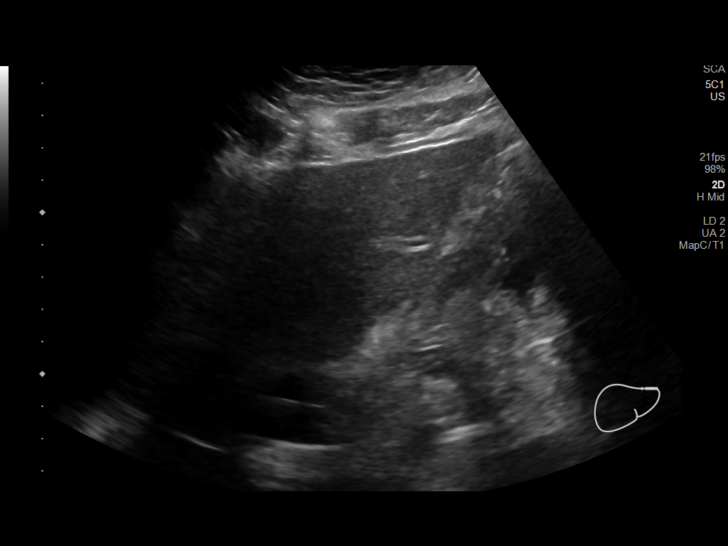
[im 19/28]
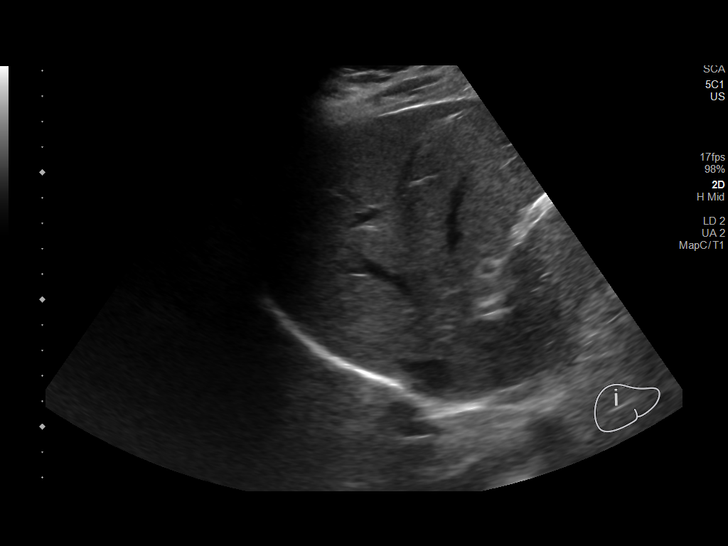
[im 21/28]
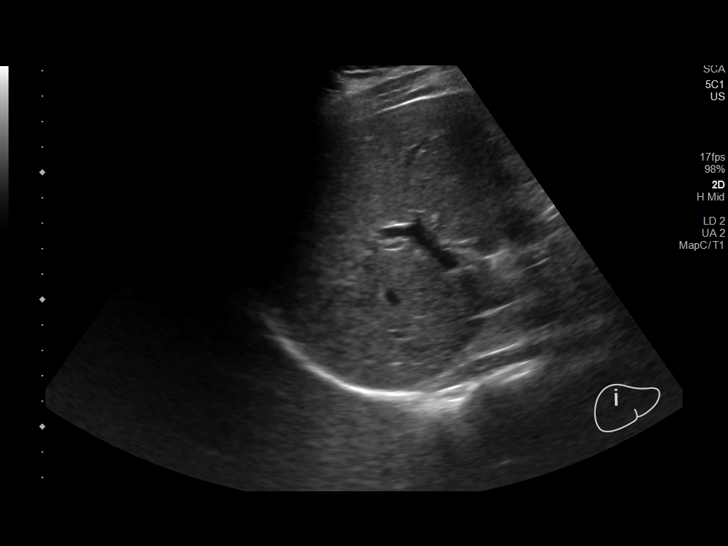
[im 23/28]
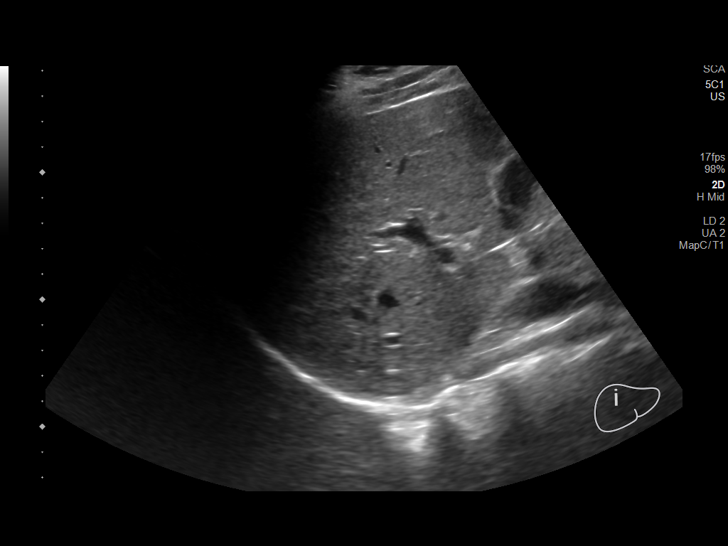
[im 25/28]
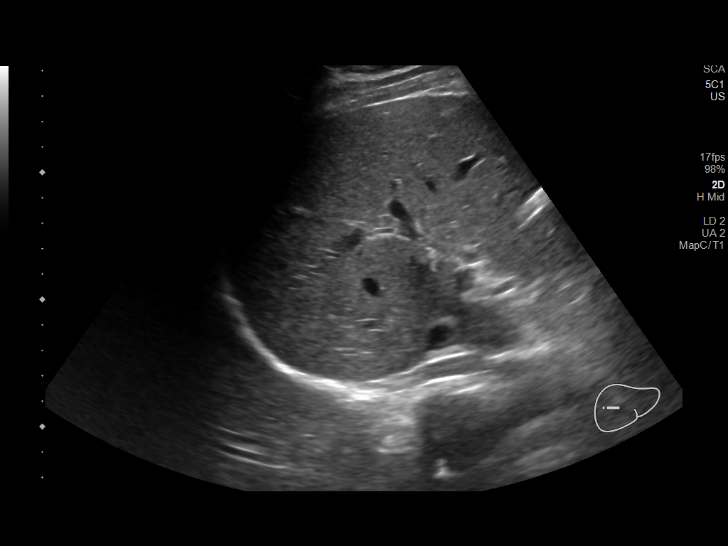
[im 28/28]
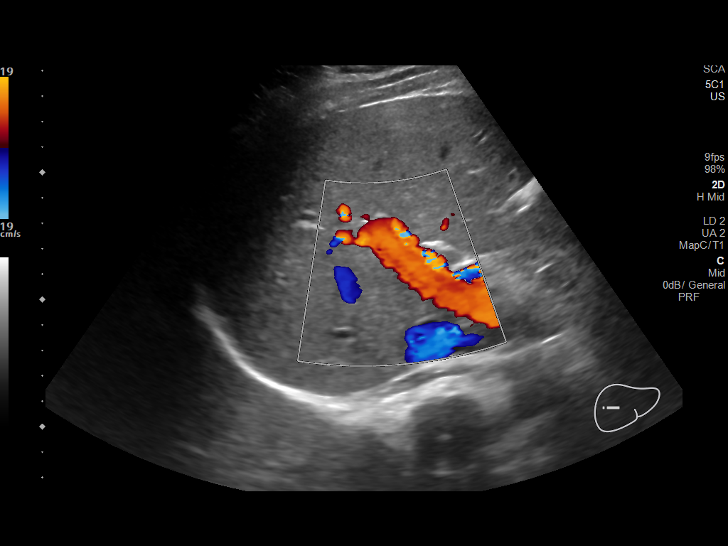

[14 of 25 positions shown; findings below may reference images not displayed]

FINDINGS: Gallbladder:

Large gallstone in the gallbladder lumen with shadowing such that
the posterior wall the gallbladder cannot be evaluated. No wall
thickening or pericholecystic fluid. No reported tenderness over the
gallbladder.

Common bile duct:

Diameter: 3.2 mm

Liver:

No focal lesion identified. Within normal limits in parenchymal
echogenicity. Portal vein is patent on color Doppler imaging with
normal direction of blood flow towards the liver.

Other: None.
IMPRESSION: Large gallstone in the gallbladder lumen. No reported tenderness
over the gallbladder or other signs of acute cholecystitis. If there
is continued RIGHT upper quadrant pain or further concern for acute
cholecystitis HIDA scan may be helpful for further evaluation.
